# Patient Record
Sex: Female | Born: 1950
Health system: Southern US, Community
[De-identification: ages and names within clinical notes are randomized; demographics above are authoritative.]

## PROBLEM LIST (undated history)

## (undated) DIAGNOSIS — K219 Gastro-esophageal reflux disease without esophagitis: Secondary | ICD-10-CM

## (undated) DIAGNOSIS — F329 Major depressive disorder, single episode, unspecified: Secondary | ICD-10-CM

## (undated) DIAGNOSIS — F32A Depression, unspecified: Secondary | ICD-10-CM

## (undated) DIAGNOSIS — F419 Anxiety disorder, unspecified: Secondary | ICD-10-CM

## (undated) HISTORY — DX: Anxiety disorder, unspecified: F41.9

## (undated) HISTORY — DX: Depression, unspecified: F32.A

## (undated) HISTORY — DX: Major depressive disorder, single episode, unspecified: F32.9

---

## 2002-12-10 ENCOUNTER — Encounter: Payer: Self-pay | Admitting: Family Medicine

## 2002-12-10 ENCOUNTER — Encounter: Admission: RE | Admit: 2002-12-10 | Discharge: 2002-12-10 | Payer: Self-pay | Admitting: Family Medicine

## 2003-11-26 ENCOUNTER — Other Ambulatory Visit: Admission: RE | Admit: 2003-11-26 | Discharge: 2003-11-26 | Payer: Self-pay | Admitting: Family Medicine

## 2003-12-15 ENCOUNTER — Encounter: Admission: RE | Admit: 2003-12-15 | Discharge: 2003-12-15 | Payer: Self-pay | Admitting: Family Medicine

## 2005-01-03 ENCOUNTER — Other Ambulatory Visit: Admission: RE | Admit: 2005-01-03 | Discharge: 2005-01-03 | Payer: Self-pay | Admitting: Family Medicine

## 2005-01-24 ENCOUNTER — Encounter: Admission: RE | Admit: 2005-01-24 | Discharge: 2005-01-24 | Payer: Self-pay | Admitting: Family Medicine

## 2006-02-21 ENCOUNTER — Encounter: Admission: RE | Admit: 2006-02-21 | Discharge: 2006-02-21 | Payer: Self-pay | Admitting: Family Medicine

## 2006-03-29 ENCOUNTER — Other Ambulatory Visit: Admission: RE | Admit: 2006-03-29 | Discharge: 2006-03-29 | Payer: Self-pay | Admitting: Family Medicine

## 2007-02-26 ENCOUNTER — Encounter: Admission: RE | Admit: 2007-02-26 | Discharge: 2007-02-26 | Payer: Self-pay | Admitting: Family Medicine

## 2008-03-24 ENCOUNTER — Encounter: Admission: RE | Admit: 2008-03-24 | Discharge: 2008-03-24 | Payer: Self-pay | Admitting: Family Medicine

## 2008-04-02 ENCOUNTER — Encounter: Admission: RE | Admit: 2008-04-02 | Discharge: 2008-04-02 | Payer: Self-pay | Admitting: Family Medicine

## 2008-09-29 ENCOUNTER — Encounter: Admission: RE | Admit: 2008-09-29 | Discharge: 2008-09-29 | Payer: Self-pay | Admitting: Family Medicine

## 2009-03-30 ENCOUNTER — Encounter: Admission: RE | Admit: 2009-03-30 | Discharge: 2009-03-30 | Payer: Self-pay | Admitting: Family Medicine

## 2010-05-18 ENCOUNTER — Encounter
Admission: RE | Admit: 2010-05-18 | Discharge: 2010-05-18 | Payer: Self-pay | Source: Home / Self Care | Attending: Family Medicine | Admitting: Family Medicine

## 2011-04-15 ENCOUNTER — Other Ambulatory Visit: Payer: Self-pay | Admitting: Family Medicine

## 2011-04-15 DIAGNOSIS — Z1231 Encounter for screening mammogram for malignant neoplasm of breast: Secondary | ICD-10-CM

## 2011-05-30 ENCOUNTER — Ambulatory Visit
Admission: RE | Admit: 2011-05-30 | Discharge: 2011-05-30 | Disposition: A | Discharge status: Home / Self Care | Payer: 59 | Source: Ambulatory Visit | Attending: Family Medicine | Admitting: Family Medicine

## 2011-05-30 DIAGNOSIS — Z1231 Encounter for screening mammogram for malignant neoplasm of breast: Secondary | ICD-10-CM

## 2011-09-21 ENCOUNTER — Ambulatory Visit
Admission: RE | Admit: 2011-09-21 | Discharge: 2011-09-21 | Disposition: A | Discharge status: Home / Self Care | Payer: 59 | Source: Ambulatory Visit | Attending: Gastroenterology | Admitting: Gastroenterology

## 2011-09-21 ENCOUNTER — Other Ambulatory Visit: Payer: Self-pay | Admitting: Gastroenterology

## 2011-09-21 DIAGNOSIS — Q438 Other specified congenital malformations of intestine: Secondary | ICD-10-CM

## 2012-05-16 ENCOUNTER — Other Ambulatory Visit: Payer: Self-pay | Admitting: Family Medicine

## 2012-05-16 DIAGNOSIS — Z1231 Encounter for screening mammogram for malignant neoplasm of breast: Secondary | ICD-10-CM

## 2012-06-04 ENCOUNTER — Ambulatory Visit
Admission: RE | Admit: 2012-06-04 | Discharge: 2012-06-04 | Disposition: A | Discharge status: Home / Self Care | Payer: Commercial Managed Care - PPO | Source: Ambulatory Visit | Attending: Family Medicine | Admitting: Family Medicine

## 2012-06-04 DIAGNOSIS — Z1231 Encounter for screening mammogram for malignant neoplasm of breast: Secondary | ICD-10-CM

## 2013-05-10 ENCOUNTER — Other Ambulatory Visit: Payer: Self-pay

## 2013-05-10 DIAGNOSIS — Z1231 Encounter for screening mammogram for malignant neoplasm of breast: Secondary | ICD-10-CM

## 2013-06-11 ENCOUNTER — Ambulatory Visit
Admission: RE | Admit: 2013-06-11 | Discharge: 2013-06-11 | Disposition: A | Discharge status: Home / Self Care | Payer: Commercial Managed Care - PPO | Source: Ambulatory Visit

## 2013-06-11 DIAGNOSIS — Z1231 Encounter for screening mammogram for malignant neoplasm of breast: Secondary | ICD-10-CM

## 2013-08-15 ENCOUNTER — Emergency Department (HOSPITAL_COMMUNITY): Payer: Commercial Managed Care - PPO

## 2013-08-15 ENCOUNTER — Encounter (HOSPITAL_COMMUNITY): Payer: Self-pay | Admitting: Emergency Medicine

## 2013-08-15 ENCOUNTER — Encounter (HOSPITAL_COMMUNITY): Payer: Self-pay

## 2013-08-15 ENCOUNTER — Observation Stay (HOSPITAL_COMMUNITY)
Admission: RE | Admit: 2013-08-15 | Discharge: 2013-08-15 | Disposition: A | Discharge status: Other Acute Inpatient Hospital | Payer: Commercial Managed Care - PPO | Attending: Family | Admitting: Family

## 2013-08-15 ENCOUNTER — Observation Stay (HOSPITAL_BASED_OUTPATIENT_CLINIC_OR_DEPARTMENT_OTHER)
Admission: AD | Admit: 2013-08-15 | Discharge: 2013-08-16 | Disposition: A | Discharge status: Home / Self Care | Payer: Commercial Managed Care - PPO | Source: Intra-hospital | Attending: Family | Admitting: Family

## 2013-08-15 ENCOUNTER — Emergency Department (HOSPITAL_COMMUNITY)
Admission: EM | Admit: 2013-08-15 | Discharge: 2013-08-15 | Disposition: A | Discharge status: Other Acute Inpatient Hospital | Payer: Commercial Managed Care - PPO | Attending: Emergency Medicine | Admitting: Emergency Medicine

## 2013-08-15 DIAGNOSIS — K219 Gastro-esophageal reflux disease without esophagitis: Secondary | ICD-10-CM | POA: Insufficient documentation

## 2013-08-15 DIAGNOSIS — F329 Major depressive disorder, single episode, unspecified: Secondary | ICD-10-CM | POA: Insufficient documentation

## 2013-08-15 DIAGNOSIS — Z79899 Other long term (current) drug therapy: Secondary | ICD-10-CM | POA: Insufficient documentation

## 2013-08-15 DIAGNOSIS — F39 Unspecified mood [affective] disorder: Secondary | ICD-10-CM | POA: Insufficient documentation

## 2013-08-15 DIAGNOSIS — H538 Other visual disturbances: Secondary | ICD-10-CM | POA: Insufficient documentation

## 2013-08-15 DIAGNOSIS — F3289 Other specified depressive episodes: Secondary | ICD-10-CM | POA: Insufficient documentation

## 2013-08-15 DIAGNOSIS — G4700 Insomnia, unspecified: Secondary | ICD-10-CM | POA: Insufficient documentation

## 2013-08-15 DIAGNOSIS — R630 Anorexia: Secondary | ICD-10-CM | POA: Insufficient documentation

## 2013-08-15 DIAGNOSIS — G479 Sleep disorder, unspecified: Secondary | ICD-10-CM | POA: Insufficient documentation

## 2013-08-15 DIAGNOSIS — R0789 Other chest pain: Secondary | ICD-10-CM | POA: Insufficient documentation

## 2013-08-15 DIAGNOSIS — F32A Depression, unspecified: Secondary | ICD-10-CM

## 2013-08-15 DIAGNOSIS — F411 Generalized anxiety disorder: Secondary | ICD-10-CM | POA: Insufficient documentation

## 2013-08-15 DIAGNOSIS — R0602 Shortness of breath: Secondary | ICD-10-CM | POA: Insufficient documentation

## 2013-08-15 HISTORY — DX: Gastro-esophageal reflux disease without esophagitis: K21.9

## 2013-08-15 LAB — RAPID URINE DRUG SCREEN, HOSP PERFORMED
Amphetamines: NOT DETECTED
BARBITURATES: NOT DETECTED
BENZODIAZEPINES: NOT DETECTED
Cocaine: NOT DETECTED
Opiates: NOT DETECTED
Tetrahydrocannabinol: NOT DETECTED

## 2013-08-15 LAB — COMPREHENSIVE METABOLIC PANEL
ALBUMIN: 4 g/dL (ref 3.5–5.2)
ALK PHOS: 53 U/L (ref 39–117)
ALT: 17 U/L (ref 0–35)
AST: 20 U/L (ref 0–37)
BUN: 10 mg/dL (ref 6–23)
CO2: 27 mEq/L (ref 19–32)
Calcium: 10 mg/dL (ref 8.4–10.5)
Chloride: 99 mEq/L (ref 96–112)
Creatinine, Ser: 0.57 mg/dL (ref 0.50–1.10)
GFR calc Af Amer: >90 mL/min (ref >90)
GFR calc non Af Amer: >90 mL/min (ref >90)
Glucose, Bld: 91 mg/dL (ref 70–99)
POTASSIUM: 3.9 meq/L (ref 3.7–5.3)
SODIUM: 138 meq/L (ref 137–147)
TOTAL PROTEIN: 6.6 g/dL (ref 6.0–8.3)
Total Bilirubin: 0.4 mg/dL (ref 0.3–1.2)

## 2013-08-15 LAB — CBC
HEMATOCRIT: 38.9 % (ref 36.0–46.0)
Hemoglobin: 14 g/dL (ref 12.0–15.0)
MCH: 32 pg (ref 26.0–34.0)
MCHC: 36 g/dL (ref 30.0–36.0)
MCV: 89 fL (ref 78.0–100.0)
PLATELETS: 286 10*3/uL (ref 150–400)
RBC: 4.37 MIL/uL (ref 3.87–5.11)
RDW: 13.1 % (ref 11.5–15.5)
WBC: 6 10*3/uL (ref 4.0–10.5)

## 2013-08-15 LAB — SALICYLATE LEVEL: Salicylate Lvl: <2 mg/dL — ABNORMAL LOW (ref 2.8–20.0)

## 2013-08-15 LAB — I-STAT TROPONIN, ED: Troponin i, poc: 0 ng/mL (ref 0.00–0.08)

## 2013-08-15 LAB — ACETAMINOPHEN LEVEL

## 2013-08-15 LAB — ETHANOL

## 2013-08-15 MED ORDER — ESCITALOPRAM OXALATE 5 MG PO TABS
5.0000 mg | ORAL_TABLET | Freq: Once | ORAL | Status: AC
  Administered 2013-08-15: 5 mg via ORAL
  Filled 2013-08-15: qty 1

## 2013-08-15 MED ORDER — CLONAZEPAM 0.5 MG PO TABS: 0.2500 mg | ORAL_TABLET | Freq: Every day | ORAL | Status: DC

## 2013-08-15 MED ORDER — POLYETHYL GLYCOL-PROPYL GLYCOL 0.4-0.3 % OP SOLN
2.0000 [drp] | Freq: Two times a day (BID) | OPHTHALMIC | Status: DC
  Filled 2013-08-15 (×4): qty 1

## 2013-08-15 MED ORDER — ESCITALOPRAM OXALATE 5 MG PO TABS
5.0000 mg | ORAL_TABLET | Freq: Every day | ORAL | Status: DC
  Filled 2013-08-15 (×3): qty 1

## 2013-08-15 MED ORDER — HYDROXYZINE HCL 25 MG PO TABS: 25.0000 mg | ORAL_TABLET | Freq: Four times a day (QID) | ORAL | Status: DC | PRN

## 2013-08-15 MED ORDER — ESCITALOPRAM OXALATE 10 MG PO TABS
10.0000 mg | ORAL_TABLET | Freq: Every day | ORAL | Status: DC
  Administered 2013-08-16: 10 mg via ORAL
  Filled 2013-08-15 (×3): qty 1

## 2013-08-15 MED ORDER — ACETAMINOPHEN 325 MG PO TABS: 650.0000 mg | ORAL_TABLET | Freq: Four times a day (QID) | ORAL | Status: DC | PRN

## 2013-08-15 MED ORDER — ALUM & MAG HYDROXIDE-SIMETH 200-200-20 MG/5ML PO SUSP: 30.0000 mL | ORAL | Status: DC | PRN

## 2013-08-15 MED ORDER — CLONAZEPAM 0.5 MG PO TABS
0.5000 mg | ORAL_TABLET | Freq: Every day | ORAL | Status: DC
  Administered 2013-08-15: 0.5 mg via ORAL
  Filled 2013-08-15: qty 1

## 2013-08-15 MED ORDER — PANTOPRAZOLE SODIUM 40 MG PO TBEC
80.0000 mg | DELAYED_RELEASE_TABLET | Freq: Every day | ORAL | Status: DC
  Administered 2013-08-16: 80 mg via ORAL
  Filled 2013-08-15 (×3): qty 2

## 2013-08-15 MED ORDER — MAGNESIUM HYDROXIDE 400 MG/5ML PO SUSP: 30.0000 mL | Freq: Every day | ORAL | Status: DC | PRN

## 2013-08-15 NOTE — ED Provider Notes (Signed)
CSN: [id number]     Arrival date & time 08/15/13  1352 History  This chart was scribed for non-physician practitioner working with Carol Ehlers, MD by Stacy Gardner, ED scribe. This patient was seen in room WTR3/WLPT3 and the patient's care was started at 3:00 PM.   First MD Initiated Contact with Patient 08/15/13 1441     Chief Complaint  Patient presents with  . Medical Clearance     (Consider location/radiation/quality/duration/timing/severity/associated sxs/prior Treatment) The history is provided by the patient and a relative.   HPI Comments: Carol Mercado is a 63 y.o. female who presents to the Emergency Department for medical clearance. Pt's daughter drove from out of state because she is concerned about her mother's worsening anxiety and depression. Pt was screened at [facility] and came to the ED for depression and anxiety. She speculates her symptoms started three weeks ago. Pt has intermittent chest tightness, difficulty breathing, loss of appetite and rapid heart rate that occurs mostly at night and when she is very anxious. The chest tightness is described as "uncomfortable" and rated as a 5/10 in severity. The episodes generally last 30 seconds to 5-10 minutes. When her symptoms presents it is typically at night when she is trying to rest and in stressful situations. The chest pain is improved with deep breathing and walking or exertion.  Her symptoms are not present outside of stressful situations. She states this is her first time having these feelings and she does not want her symptoms to overcome who she is. She has thoughts of hopelessness and feeling dismayed however she denies SI. Denies plan for suicide.  Pt's daughter states her symptoms started suddenly. Pt initially tried Ambien however it did not "agree" with her and she stopped taking it two days later. She was seen eight days ago and was given Lexapro and Xanax. Pt's daughter and husband mentions her symptoms are getting  worse. 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   Pt has visual changes due to spending a great amount of time on her computer. Pt feels that she is "straining" to see.  She wears contacts but reports they are ineffective. Denies seeing visual spots, loss of parts of visual field, dark curtain over her vision, diplipia. Pt was last seen at her opthalmologist four months ago.  Past Medical History  Diagnosis Date  . GERD (gastroesophageal reflux disease)    History reviewed. No pertinent past surgical history. No family history on file. History  Substance Use Topics  . Smoking status: Never Smoker   . Smokeless tobacco: Never Used  . Alcohol Use: 0.6 oz/week    1 Glasses of wine per week   OB History   Grav Para Term Preterm Abortions TAB SAB Ect Mult Living                 Review of Systems  Constitutional: Positive for activity change and appetite change. Negative for diaphoresis.  Eyes: Positive for visual disturbance.  Respiratory: Positive for chest tightness and shortness of breath. Negative for cough (chronic cough but not abnormal from baseline).   Cardiovascular:       Racing heart rate  Gastrointestinal: Negative for nausea and vomiting.  Psychiatric/Behavioral: Positive for sleep disturbance and dysphoric mood. Negative for suicidal ideas. The patient is nervous/anxious.   All other systems reviewed and are negative.     Allergies  Aspartame and phenylalanine  Home Medications  Prior to Admission medications   Medication Sig Start Date End Date Taking? Authorizing Provider  ALPRAZolam Duanne Moron) 0.25 MG tablet Take 0.25 mg by mouth at bedtime as needed for anxiety.    Yes Historical Provider, MD  azelastine (ASTELIN) 137 MCG/SPRAY nasal spray Place 2 sprays into both nostrils daily. Use in each nostril as directed   Yes Historical Provider, MD  escitalopram (LEXAPRO) 10 MG tablet Take 5 mg by mouth daily.   Yes  Historical Provider, MD  MELATONIN PO Take 1 tablet by mouth daily as needed (for sleep).   Yes Historical Provider, MD  omeprazole (PRILOSEC) 40 MG capsule Take 40 mg by mouth daily.   Yes Historical Provider, MD  Polyethyl Glycol-Propyl Glycol (SYSTANE) 0.4-0.3 % SOLN Place 2 drops into both eyes 2 (two) times daily.   Yes Historical Provider, MD   BP 142/79  Temp(Src) 98 F (36.7 C) (Oral)  SpO2 97% Physical Exam  Nursing note and vitals reviewed. Constitutional: She appears well-developed and well-nourished. No distress.  HENT:  Head: Normocephalic and atraumatic.  Eyes: Conjunctivae and EOM are normal. Right eye exhibits no discharge. Left eye exhibits no discharge. No scleral icterus.  Neck: Normal range of motion. Neck supple.  Cardiovascular: Normal rate and regular rhythm.   Pulmonary/Chest: Effort normal and breath sounds normal. No respiratory distress. She has no wheezes. She has no rales.  Abdominal: Soft. She exhibits no distension. There is no tenderness. There is no rebound and no guarding.  Neurological: She is alert.  CN II-XII intact, EOMs intact, no pronator drift, grip strengths equal bilaterally; strength 5/5 in all extremities, sensation intact in all extremities; finger to nose, heel to shin, rapid alternating movements normal; gait is normal.     Skin: She is not diaphoretic.  Psychiatric: Her speech is normal and behavior is normal. She exhibits a depressed mood. She expresses no suicidal plans.    ED Course  Procedures (including critical care time) DIAGNOSTIC STUDIES: Oxygen Saturation is 97% on room air, normal by my interpretation.    COORDINATION OF CARE:  3:09 PM Discussed course of care with pt . Pt understands and agrees.    Labs Review Labs Reviewed  SALICYLATE LEVEL - Abnormal; Notable for the following:    Salicylate Lvl <5.8 (*)    All other components within normal limits  ACETAMINOPHEN LEVEL  CBC  COMPREHENSIVE METABOLIC PANEL   ETHANOL  URINE RAPID DRUG SCREEN (HOSP PERFORMED)  Randolm Idol, ED    Imaging Review Dg Chest 2 View  08/15/2013   CLINICAL DATA:  MEDICAL CLEARANCE  EXAM: CHEST  2 VIEW  COMPARISON:  None.  FINDINGS: The heart size and mediastinal contours are within normal limits. Both lungs are clear. S-shaped scoliosis in the thoracolumbar spine.  IMPRESSION: No active cardiopulmonary disease.   Electronically Signed   By: Margaree Mackintosh M.D.   On: 08/15/2013 15:33     EKG Interpretation   Date/Time:  Thursday August 15 2013 14:42:16 EDT Ventricular Rate:  69 PR Interval:  141 QRS Duration: 82 QT Interval:  388 QTC Calculation: 416 R Axis:   -8 Text Interpretation:  Sinus rhythm Probable left atrial enlargement  Borderline low voltage, extremity leads Baseline wander in lead(s) I III  aVL V5 Confirmed by [place] ([id number]) on 08/15/2013 4:41:16 PM      MDM   Final diagnoses:  Depression    Pt with worsening depression over 3 weeks, making it difficult for her to function.  She has vague notions of not wanting to go on with life but no plan for suicide.  Has started medications for depression from her PCP but continues to get worse.  Sent to behavioral health for further evaluation and likely admission for depression.      Clayton Bibles, PA-C 08/15/13 1837

## 2013-08-15 NOTE — ED Notes (Addendum)
Admitted at [facility] in observation room for depression and anxiety. Reports no neurological workup. Pt reports "I am struggling with my vision." Wears contacts but has not been to see optometrist. Daughter reports that "everything has happened at once with the vision and depression and everything." Patient has only been taking Lexapro and Xanax are new-has been taking for 8 days. Feels like her "head and heart are off kilter and the anxiety makes my chest constrict."

## 2013-08-15 NOTE — H&P (Signed)
[facility] OBS Admission Note & SRA  08/15/2013 7:58 PM Carol Mercado  MRN:  [id number] Subjective:  Pt presents to OBS complaining of severe anxiety and depression along with anxiety attacks. Pt denies SI, HI, and AVH, and contracts for safety. However, due to pt's instability, there is question as to whether or not she can function well in daily life or complete ADL's without assistance; this concern is also affirmed by family. Pt will be observed in OBS UNIT to determine more accurate disposition.   HPI: Carol Mercado is a 63 y.o. female who presents to the Emergency Department for medical clearance. Pt's daughter drove from out of state because she is concerned about her mother's worsening anxiety and depression. Pt was screened at [facility] and came to the ED for depression and anxiety. She speculates her symptoms started three weeks ago. Pt has intermittent chest tightness, difficulty breathing, loss of appetite and rapid heart rate that occurs mostly at night and when she is very anxious. The chest tightness is described as "uncomfortable" and rated as a 5/10 in severity. The episodes generally last 30 seconds to 5-10 minutes. When her symptoms presents it is typically at night when she is trying to rest and in stressful situations. The chest pain is improved with deep breathing and walking or exertion. Her symptoms are not present outside of stressful situations. She states this is her first time having these feelings and she does not want her symptoms to overcome who she is. She has thoughts of hopelessness and feeling dismayed however she denies SI. Denies plan for suicide.  Pt's daughter states her symptoms started suddenly. Pt initially tried Ambien however it did not "agree" with her and she stopped taking it two days later. She was seen eight days ago and was given Lexapro and Xanax. Pt's daughter and husband mentions her symptoms are getting worse. 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 Pt has visual changes due to spending a great amount of time on her computer. Pt feels that she is "straining" to see. She wears contacts but reports they are ineffective. Denies seeing visual spots, loss of parts of visual field, dark curtain over her vision, diplipia. Pt was last seen at her opthalmologist four months ago.     Diagnosis:   DSM5: Depressive Disorders:  Major Depressive Disorder - Severe (296.23) Total Time spent with patient: 30 minutes  Axis I: Major Depression, Recurrent severe Axis II: Deferred Axis III:  Past Medical History  Diagnosis Date  . GERD (gastroesophageal reflux disease)   . Anxiety   . Depression    Axis IV: other psychosocial or environmental problems and problems related to social environment Axis V: 51-60 moderate symptoms  ADL's:  Impaired  Sleep: Fair  Appetite:  Poor  Suicidal Ideation:  Affirms SI without specific plan Homicidal Ideation:  Denies AEB (as evidenced by):  Psychiatric Specialty Exam: Physical Exam Full Physical Exam performed in ED; reviewed, stable, and I concur with this assessment.   Review of Systems  Constitutional: Negative.   HENT: Negative.   Eyes: Negative.   Respiratory: Negative.   Cardiovascular: Negative.   Gastrointestinal: Negative.   Genitourinary: Negative.   Musculoskeletal: Negative.   Skin: Negative.   Neurological: Negative.   Endo/Heme/Allergies: Negative.   Psychiatric/Behavioral: Positive for depression. The patient is nervous/anxious.     Blood pressure 106/70, pulse 77, temperature 97.6 F (36.4 C), temperature source Oral, resp. rate  17.There is no height or weight on file to calculate BMI.   General Appearance: Fairly Groomed   Engineer, water:: Good   Speech: Clear and Coherent   Volume: Normal   Mood: Dysphoric   Affect: Congruent   Thought Process: Coherent   Orientation: Full (Time, Place, and Person)   Thought Content: WDL   Suicidal  Thoughts: Yes, without specific plan  Homicidal Thoughts: No   Memory: Immediate; Good   Judgement: Good   Insight: Fair   Psychomotor Activity: Decreased   Concentration: Fair   Recall: [place] of Knowledge:Good   Language: Good   Akathisia: NA   Handed: Right   AIMS (if indicated): NA   Assets: Communication Skills  Desire for Improvement  Financial Resources/Insurance  Tea  Talents/Skills  Vocational/Educational   Sleep: Very disturbed/poor     Musculoskeletal: Strength & Muscle Tone: within normal limits Gait & Station: normal Patient leans: N/A  Current Medications: Current Facility-Administered Medications  Medication Dose Route Frequency Provider Last Rate Last Dose  . acetaminophen (TYLENOL) tablet 650 mg  650 mg Oral Q6H PRN Benjamine Mola, FNP      . alum & mag hydroxide-simeth (MAALOX/MYLANTA) 200-200-20 MG/5ML suspension 30 mL  30 mL Oral Q4H PRN Benjamine Mola, FNP      . clonazePAM (KLONOPIN) tablet 0.5 mg  0.5 mg Oral QHS Benjamine Mola, FNP      . [START ON 08/16/2013] escitalopram (LEXAPRO) tablet 10 mg  10 mg Oral Daily John C Withrow, FNP      . escitalopram (LEXAPRO) tablet 5 mg  5 mg Oral Once Benjamine Mola, FNP      . hydrOXYzine (ATARAX/VISTARIL) tablet 25 mg  25 mg Oral Q6H PRN Benjamine Mola, FNP      . magnesium hydroxide (MILK OF MAGNESIA) suspension 30 mL  30 mL Oral Daily PRN Benjamine Mola, FNP      . pantoprazole (PROTONIX) EC tablet 80 mg  80 mg Oral Daily Benjamine Mola, FNP      . Polyethyl Glycol-Propyl Glycol 0.4-0.3 % SOLN 2 drop  2 drop Both Eyes BID Benjamine Mola, FNP        Lab Results:  Results for orders placed during the hospital encounter of 08/15/13 (from the past 48 hour(s))  URINE RAPID DRUG SCREEN (HOSP PERFORMED)     Status: None   Collection Time    08/15/13  2:43 PM      Result Value Ref Range   Opiates NONE DETECTED  NONE DETECTED   Cocaine NONE DETECTED  NONE DETECTED   Benzodiazepines NONE  DETECTED  NONE DETECTED   Amphetamines NONE DETECTED  NONE DETECTED   Tetrahydrocannabinol NONE DETECTED  NONE DETECTED   Barbiturates NONE DETECTED  NONE DETECTED   Comment:            DRUG SCREEN FOR MEDICAL PURPOSES     ONLY.  IF CONFIRMATION IS NEEDED     FOR ANY PURPOSE, NOTIFY LAB     WITHIN 5 DAYS.                LOWEST DETECTABLE LIMITS     FOR URINE DRUG SCREEN     Drug Class       Cutoff (ng/mL)     Amphetamine      1000     Barbiturate      200     Benzodiazepine   200  Tricyclics       625     Opiates          300     Cocaine          300     THC              50  ACETAMINOPHEN LEVEL     Status: None   Collection Time    08/15/13  3:34 PM      Result Value Ref Range   Acetaminophen (Tylenol), Serum <15.0  10 - 30 ug/mL   Comment:            THERAPEUTIC CONCENTRATIONS VARY     SIGNIFICANTLY. A RANGE OF 10-30     ug/mL MAY BE AN EFFECTIVE     CONCENTRATION FOR MANY PATIENTS.     HOWEVER, SOME ARE BEST TREATED     AT CONCENTRATIONS OUTSIDE THIS     RANGE.     ACETAMINOPHEN CONCENTRATIONS     >150 ug/mL AT 4 HOURS AFTER     INGESTION AND >50 ug/mL AT 12     HOURS AFTER INGESTION ARE     OFTEN ASSOCIATED WITH TOXIC     REACTIONS.  CBC     Status: None   Collection Time    08/15/13  3:34 PM      Result Value Ref Range   WBC 6.0  4.0 - 10.5 K/uL   RBC 4.37  3.87 - 5.11 MIL/uL   Hemoglobin 14.0  12.0 - 15.0 g/dL   HCT 38.9  36.0 - 46.0 %   MCV 89.0  78.0 - 100.0 fL   MCH 32.0  26.0 - 34.0 pg   MCHC 36.0  30.0 - 36.0 g/dL   RDW 13.1  11.5 - 15.5 %   Platelets 286  150 - 400 K/uL  COMPREHENSIVE METABOLIC PANEL     Status: None   Collection Time    08/15/13  3:34 PM      Result Value Ref Range   Sodium 138  137 - 147 mEq/L   Potassium 3.9  3.7 - 5.3 mEq/L   Chloride 99  96 - 112 mEq/L   CO2 27  19 - 32 mEq/L   Glucose, Bld 91  70 - 99 mg/dL   BUN 10  6 - 23 mg/dL   Creatinine, Ser 0.57  0.50 - 1.10 mg/dL   Calcium 10.0  8.4 - 10.5 mg/dL   Total  Protein 6.6  6.0 - 8.3 g/dL   Albumin 4.0  3.5 - 5.2 g/dL   AST 20  0 - 37 U/L   ALT 17  0 - 35 U/L   Alkaline Phosphatase 53  39 - 117 U/L   Total Bilirubin 0.4  0.3 - 1.2 mg/dL   GFR calc non Af Amer >90  >90 mL/min   GFR calc Af Amer >90  >90 mL/min   Comment: (NOTE)     The eGFR has been calculated using the CKD EPI equation.     This calculation has not been validated in all clinical situations.     eGFR's persistently <90 mL/min signify possible Chronic Kidney     Disease.  ETHANOL     Status: None   Collection Time    08/15/13  3:34 PM      Result Value Ref Range   Alcohol, Ethyl (B) <11  0 - 11 mg/dL   Comment:  LOWEST DETECTABLE LIMIT FOR     SERUM ALCOHOL IS 11 mg/dL     FOR MEDICAL PURPOSES ONLY  SALICYLATE LEVEL     Status: Abnormal   Collection Time    08/15/13  3:34 PM      Result Value Ref Range   Salicylate Lvl <1.2 (*) 2.8 - 20.0 mg/dL  I-STAT TROPOININ, ED     Status: None   Collection Time    08/15/13  3:41 PM      Result Value Ref Range   Troponin i, poc 0.00  0.00 - 0.08 ng/mL   Comment 3            Comment: Due to the release kinetics of cTnI,     a negative result within the first hours     of the onset of symptoms does not rule out     myocardial infarction with certainty.     If myocardial infarction is still suspected,     repeat the test at appropriate intervals.    Physical Findings: AIMS:  , ,  ,  ,    CIWA:    COWS:     Treatment Plan Summary: Daily contact with patient to assess and evaluate symptoms and progress in treatment Medication management  Plan: Review of chart, vital signs, medications, and notes.  1-Medication management for depression and anxiety: Medications reviewed with the patient and she stated no untoward effects, unchanged. 2-Coping skills for depression, anxiety  3-Continue crisis stabilization and management  4-Address health issues--monitoring vital signs, stable  5-Treatment plan in progress to  prevent relapse of depression and anxiety   Medical Decision Making Problem Points:  Established problem, stable/improving (1) and Review of psycho-social stressors (1) Data Points:  Review or order clinical lab tests (1) Review or order medicine tests (1) Review of medication regiment & side effects (2) Review of new medications or change in dosage (2)   Suicide Risk Admission Assessment     Nursing information obtained from:  Patient Demographic factors:  Caucasian Current Mental Status:  NA Loss Factors:  NA Historical Factors:  Family history of mental illness or substance abuse Risk Reduction Factors:  Sense of responsibility to family;Religious beliefs about death;Living with another person, especially a relative;Positive social support Total Time spent with patient: 30 minutes  CLINICAL FACTORS:   Depression:   Anhedonia Hopelessness Impulsivity Unstable or Poor Therapeutic Relationship Previous Psychiatric Diagnoses and Treatments  Psychiatric Specialty Exam:     Blood pressure 139/89, pulse 84, temperature 98.1 F (36.7 C), temperature source Oral, resp. rate 16, height '5\' 2"'  (1.575 m), weight 52.164 kg (115 lb).Body mass index is 21.03 kg/(m^2).  SEE PSE ABOVE  COGNITIVE FEATURES THAT CONTRIBUTE TO RISK:  Polarized thinking    SUICIDE RISK:   Moderate:  Frequent suicidal ideation with limited intensity, and duration, some specificity in terms of plans, no associated intent, good self-control, limited dysphoria/symptomatology, some risk factors present, and identifiable protective factors, including available and accessible social support.   Benjamine Mola, FNP-BC 08/15/2013, 7:58 PM Agree with assessment and plan Geralyn Flash A. Sabra Heck, M.D.

## 2013-08-15 NOTE — ED Notes (Signed)
[facility] observation unit called and spoke to [place], Therapist, sports. No handoff report requested. Updated provider on patient's current condition and suicidal ideations. Family aware and updated. Admitting physician [place], Mojeed. [facility] transport called.

## 2013-08-15 NOTE — ED Provider Notes (Signed)
Medical screening examination/treatment/procedure(s) were performed by non-physician practitioner and as supervising physician I was immediately available for consultation/collaboration.   Neta Ehlers, MD 08/15/13 [phone]

## 2013-08-15 NOTE — Progress Notes (Signed)
Per Lenna Gilford NP patient sent to [facility] by [facility] for med clearance.

## 2013-08-15 NOTE — Progress Notes (Signed)
Patient in bed since the beginning of the shift. Her mood and affects flat and depressed. Answered all questiond appropriately. She denied SI/HI and denied Hallucinations. Writer encouraged and supported patient. She refused her eye drops and some of her HS medications. Received Clonopin at HS. Q 15 minute check continues as ordered to maintain safety.

## 2013-08-15 NOTE — BH Assessment (Signed)
63 year old female admitted as a walk-in, accompanied by her husband, for increased depression and anxiety. Patient admits to feelings overwhelmed since March 2015. Patient admits to to feeling overwhelmed, anxious, hopeless and helpless. She denies SI/HI or psychosis. 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 Patient has had no previous mental health hospitalizations or mental health counseling. She is currently seeing Dr. Carol Ada at [facility] for her depression. She was started on Lexapro 5 mg QD and Xanax .25 mg TID PRN. Patient oriented to unit. Daughter is currently at her bedside for support.

## 2013-08-15 NOTE — BH Assessment (Signed)
NP in to speak with patient and patient's daughter.

## 2013-08-15 NOTE — BH Assessment (Addendum)
Patient reports that she is having vision problems and that documents appear blurry. Patient wears contacts that she states are not very strong but that the change in vision is a recent thing. Patient does not have her contacts with her and is using her husband's readers to complete her paperwork.  Patient's daughter is requesting to speak with NP. NP called with request.

## 2013-08-15 NOTE — BH Assessment (Signed)
Tele Assessment Note   Carol Mercado is an 63 y.o. female that presents to [facility] as a walk in with symptoms or worsening depression and anxiety. She was brought to [facility] by her spouse. She was also referred to [facility] by a close family friend/psychiatrist Dr. Launa Flight. Patient shares with this Probation officer that she has experienced many stressors starting around Christmas time 2014- present. She broke her thumb which required surgery. Next, she began having difficulty with her voice causing her not to be able to do what she loves so dearly "singing". 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 Last, her job at [employer] has suffered as she is not able to complete her job task. She was told by her supervisor to take today and tomorrow off as it's evident that patient is not able to complete job task (example: formulating emails or scheduling staff). Patient fears that she may have loss her job and may not be able to return. She does not have a prior hx of depression or anxiety. However, over the past months her anxiety has remained severe. Her depression has worsened with associated loss of interest in usual pleasures (walking and cycling), hopelessness, crying spells, and isolating self from others. Her appetite is poor with approx. 3-4 pounds of weight loss. She has not slept well in the past 3-4 weeks and remains constantly tired at work..   Patient reports passive suicidal thoughts with no plan or intent. She finds hope/motivation in her family: children, spouse, and children to improve her current symptoms. Her daughter is currently in route from [name] to provide support at this time. No HI or AVH's. No alcohol or drug use reported. No current outpatient mental health provider reported. No hx of inpatient hospitalization reported.  Writer ran Radiographer, therapeutic by Heloise Purpura, NP and the observation unit was recommended. Patient accepted to the OBS Unit and placed in bed #2.  Axis  I: Anxiety Disorder NOS and Depressive Disorder NOS Axis II: Deferred Axis III: No past medical history on file. Axis IV: economic problems, occupational problems, other psychosocial or environmental problems, problems related to social environment and problems with access to health care services Axis V: 31-40 impairment in reality testing  Past Medical History: No past medical history on file.  No past surgical history on file.  Family History: No family history on file.  Social History:  has no tobacco, alcohol, and drug history on file.  Additional Social History:     CIWA:   COWS:    Allergies: Allergies not on file  Home Medications:  No prescriptions prior to admission    OB/GYN Status:  No LMP recorded. Patient is postmenopausal.  General Assessment Data Location of Assessment: [facility] Is this a Tele or Face-to-Face Assessment?: Face-to-Face Is this an Initial Assessment or a Re-assessment for this encounter?: Initial Assessment Living Arrangements: Spouse/significant other Can pt return to current living arrangement?: Yes Admission Status: Voluntary Is patient capable of signing voluntary admission?: Yes Transfer from: [facility] Referral Source: Self/Family/Friend     [place] Living Arrangements: Spouse/significant other Name of Psychiatrist:  (No psychiatrist) Name of Therapist:  (No therapist )  Education Status Is patient currently in school?: No  Risk to self Suicidal Ideation: Yes-Currently Present Suicidal Intent: No Is patient at risk for suicide?: No Suicidal Plan?: No Access to Means: No What has been your use of drugs/alcohol within the last 12 months?:  (no alcohol  or drug use) Previous Attempts/Gestures: No How many times?:  (0) Other Self Harm Risks:  (none reported ) Triggers for Past Attempts: Other (Comment) (no previous attempts or gestures ) Intentional Self Injurious Behavior: None Family Suicide  History: Unknown Recent stressful life event(s): Other (Comment);Loss (Comment);Job Loss;Financial Problems (not functioning at work, possibly loss job, sings-voice px's) Persecutory voices/beliefs?: No Depression: Yes Depression Symptoms: Feeling angry/irritable;Feeling worthless/self pity;Loss of interest in usual pleasures;Fatigue;Isolating;Tearfulness;Insomnia;Despondent;Guilt Substance abuse history and/or treatment for substance abuse?: No Suicide prevention information given to non-admitted patients: Not applicable  Risk to Others Homicidal Ideation: No Thoughts of Harm to Others: No Current Homicidal Intent: No Current Homicidal Plan: No Access to Homicidal Means: No Identified Victim:  (n/a) History of harm to others?: No Assessment of Violence: None Noted Violent Behavior Description:  (patient is calm and cooperative ) Does patient have access to weapons?: No Criminal Charges Pending?: No Does patient have a court date: No  Psychosis Hallucinations: None noted Delusions: None noted  Mental Status Report Appear/Hygiene: Other (Comment) (approrpiate ) Eye Contact: Fair Motor Activity: Restlessness (appears nervous and slightly tremorous) Speech: Logical/coherent Level of Consciousness: Alert Mood: Depressed;Anxious;Sad;Preoccupied Affect: Preoccupied;Depressed Anxiety Level: Severe Thought Processes: Coherent;Relevant Judgement: Unimpaired Orientation: Person;Situation;Time;Place Obsessive Compulsive Thoughts/Behaviors: None  Cognitive Functioning Concentration: Decreased Memory: Recent Intact;Remote Intact IQ: Average Insight: Fair Impulse Control: Fair Appetite: Poor Weight Loss:  (pt reports wt. loss but unaware of how much) Weight Gain:  (none reported ) Sleep: Decreased Total Hours of Sleep:  (pt tried Ambien 3-4 days; ended use due to side effects ) Vegetative Symptoms: Staying in bed;Decreased grooming;Not bathing  ADLScreening [facility] Assessment  Services) Patient's cognitive ability adequate to safely complete daily activities?: Yes Patient able to express need for assistance with ADLs?: Yes Independently performs ADLs?: Yes (appropriate for developmental age)  Prior Inpatient Therapy Prior Inpatient Therapy: No Prior Therapy Dates:  (n/a) Prior Therapy Facilty/Provider(s):  (n/a) Reason for Treatment:  (n/a)  Prior Outpatient Therapy Prior Outpatient Therapy: No Prior Therapy Dates:  (n/a) Prior Therapy Facilty/Provider(s):  (n/a) Reason for Treatment:  (n/a)  ADL Screening (condition at time of admission) Patient's cognitive ability adequate to safely complete daily activities?: Yes Is the patient deaf or have difficulty hearing?: No Does the patient have difficulty seeing, even when wearing glasses/contacts?: No Does the patient have difficulty concentrating, remembering, or making decisions?: No Patient able to express need for assistance with ADLs?: Yes Does the patient have difficulty dressing or bathing?: No Independently performs ADLs?: Yes (appropriate for developmental age) Does the patient have difficulty walking or climbing stairs?: No Weakness of Legs: None Weakness of Arms/Hands: None  Home Assistive Devices/Equipment Home Assistive Devices/Equipment: None    Abuse/Neglect Assessment (Assessment to be complete while patient is alone) Physical Abuse: Denies Verbal Abuse: Denies Sexual Abuse: Denies Exploitation of patient/patient's resources: Denies Self-Neglect: Denies Values / Beliefs Cultural Requests During Hospitalization: None Spiritual Requests During Hospitalization: None   Advance Directives (For Healthcare) Advance Directive: Patient does not have advance directive Nutrition Screen- [place] Adult/WL/AP Patient's home diet: Regular  Additional Information 1:1 In Past 12 Months?: No CIRT Risk: No Elopement Risk: No Does patient have medical clearance?: Yes     Disposition:   Disposition Initial Assessment Completed for this Encounter: Yes Disposition of Patient: Inpatient treatment program Type of inpatient treatment program: Adult  Evangeline Gula 08/15/2013 11:21 AM

## 2013-08-15 NOTE — Discharge Instructions (Signed)
Read the information below.  You may return to the Emergency Department at any time for worsening condition or any new symptoms that concern you.  Please go directly to [facility] for further evaluation and treatment.    Depression, Adult Depression refers to feeling sad, low, down in the dumps, blue, gloomy, or empty. In general, there are two kinds of depression: 1. Depression that we all experience from time to time because of upsetting life experiences, including the loss of a job or the ending of a relationship (normal sadness or normal grief). This kind of depression is considered normal, is short lived, and resolves within a few days to 2 weeks. (Depression experienced after the loss of a loved one is called bereavement. Bereavement often lasts longer than 2 weeks but normally gets better with time.) 2. Clinical depression, which lasts longer than normal sadness or normal grief or interferes with your ability to function at home, at work, and in school. It also interferes with your personal relationships. It affects almost every aspect of your life. Clinical depression is an illness. Symptoms of depression also can be caused by conditions other than normal sadness and grief or clinical depression. Examples of these conditions are listed as follows:  Physical illness Some physical illnesses, including underactive thyroid gland (hypothyroidism), severe anemia, specific types of cancer, diabetes, uncontrolled seizures, heart and lung problems, strokes, and chronic pain are commonly associated with symptoms of depression.  Side effects of some prescription medicine In some people, certain types of prescription medicine can cause symptoms of depression.  Substance abuse Abuse of alcohol and illicit drugs can cause symptoms of depression. SYMPTOMS Symptoms of normal sadness and normal grief include the following:  Feeling sad or crying for short periods of time.  Not caring about anything  (apathy).  Difficulty sleeping or sleeping too much.  No longer able to enjoy the things you used to enjoy.  Desire to be by oneself all the time (social isolation).  Lack of energy or motivation.  Difficulty concentrating or remembering.  Change in appetite or weight.  Restlessness or agitation. Symptoms of clinical depression include the same symptoms of normal sadness or normal grief and also the following symptoms:  Feeling sad or crying all the time.  Feelings of guilt or worthlessness.  Feelings of hopelessness or helplessness.  Thoughts of suicide or the desire to harm yourself (suicidal ideation).  Loss of touch with reality (psychotic symptoms). Seeing or hearing things that are not real (hallucinations) or having false beliefs about your life or the people around you (delusions and paranoia). DIAGNOSIS  The diagnosis of clinical depression usually is based on the severity and duration of the symptoms. Your caregiver also will ask you questions about your medical history and substance use to find out if physical illness, use of prescription medicine, or substance abuse is causing your depression. Your caregiver also may order blood tests. TREATMENT  Typically, normal sadness and normal grief do not require treatment. However, sometimes antidepressant medicine is prescribed for bereavement to ease the depressive symptoms until they resolve. The treatment for clinical depression depends on the severity of your symptoms but typically includes antidepressant medicine, counseling with a mental health professional, or a combination of both. Your caregiver will help to determine what treatment is best for you. Depression caused by physical illness usually goes away with appropriate medical treatment of the illness. If prescription medicine is causing depression, talk with your caregiver about stopping the medicine, decreasing the dose, or  substituting another medicine. Depression  caused by abuse of alcohol or illicit drugs abuse goes away with abstinence from these substances. Some adults need professional help in order to stop drinking or using drugs. SEEK IMMEDIATE CARE IF:  You have thoughts about hurting yourself or others.  You lose touch with reality (have psychotic symptoms).  You are taking medicine for depression and have a serious side effect. FOR MORE INFORMATION National Alliance on Mental Illness: www.nami.[employer] of Mental [employer]: https://carter.com/ Document Released: 04/08/2000 Document Revised: 10/11/2011 Document Reviewed: 07/11/2011 [facility] Patient Information 2014 [place].

## 2013-08-16 ENCOUNTER — Encounter (HOSPITAL_COMMUNITY): Payer: Self-pay | Admitting: *Deleted

## 2013-08-16 DIAGNOSIS — G479 Sleep disorder, unspecified: Secondary | ICD-10-CM

## 2013-08-16 DIAGNOSIS — F321 Major depressive disorder, single episode, moderate: Secondary | ICD-10-CM

## 2013-08-16 MED ORDER — ESCITALOPRAM OXALATE 10 MG PO TABS: 10.0000 mg | ORAL_TABLET | Freq: Every day | ORAL | Status: AC

## 2013-08-16 MED ORDER — TRAZODONE HCL 50 MG PO TABS: ORAL_TABLET | ORAL | Status: AC

## 2013-08-16 MED ORDER — POLYVINYL ALCOHOL 1.4 % OP SOLN
2.0000 [drp] | Freq: Two times a day (BID) | OPHTHALMIC | Status: DC
  Filled 2013-08-16: qty 15

## 2013-08-16 NOTE — Progress Notes (Signed)
[facility] INPATIENT:  Family/Significant Other Suicide Prevention Education  Suicide Prevention Education:  Education Completed; husband has been identified by the patient as the family member/significant other with whom the patient will be residing, and identified as the person(s) who will aid the patient in the event of a mental health crisis (suicidal ideations/suicide attempt).  With written consent from the patient, the family member/significant other has been provided the following suicide prevention education, prior to the and/or following the discharge of the patient.  The suicide prevention education provided includes the following:  Suicide risk factors  Suicide prevention and interventions  National Suicide Hotline telephone number  [facility] assessment telephone number  [facility] Emergency Assistance [place] and/or Residential Mobile Crisis Unit telephone number  Request made of family/significant other to:  Remove weapons (e.g., guns, rifles, knives), all items previously/currently identified as safety concern.    Remove drugs/medications (over-the-counter, prescriptions, illicit drugs), all items previously/currently identified as a safety concern.  The family member/significant other verbalizes understanding of the suicide prevention education information provided.  The family member/significant other agrees to remove the items of safety concern listed above.  Debbrah Alar 08/16/2013, 1:09 PM

## 2013-08-16 NOTE — Discharge Summary (Signed)
Physician Discharge Summary Note  Patient:  Carol Mercado is an 63 y.o., female MRN:  [id number] DOB:  [birth year] Patient phone:  [phone] (home)  Patient address:   [address] [place] [address],  Total Time spent with patient: 45 minutes  Date of Admission:  08/15/2013 Date of Discharge: 08/16/2013  Reason for Admission: Worsening c/o  Depression with insomnia  Discharge Diagnoses: Active Problems:   MDD (major depressive disorder)   Psychiatric Specialty Exam: Physical Exam  Nursing note and vitals reviewed. Constitutional: She is oriented to person, place, and time. She appears well-developed and well-nourished.  HENT:  Head: Normocephalic and atraumatic.  Right Ear: External ear normal.  Left Ear: External ear normal.  Nose: Nose normal.  Eyes: Conjunctivae and EOM are normal. Pupils are equal, round, and reactive to light. Right eye exhibits no discharge. Left eye exhibits no discharge. No scleral icterus.  Neck: Neck supple. No tracheal deviation present. No thyromegaly present.  Cardiovascular: Normal rate and regular rhythm.   Respiratory: Effort normal and breath sounds normal. No respiratory distress.  GI:  deferred  Genitourinary:  deferred  Musculoskeletal: Normal range of motion. She exhibits no edema and no tenderness.  Neurological: She is alert and oriented to person, place, and time. No cranial nerve deficit. She exhibits normal muscle tone. Coordination normal.  Skin: Skin is warm and dry.  Psychiatric:  See mse below    Review of Systems  Constitutional: Positive for malaise/fatigue. Negative for fever, chills, weight loss and diaphoresis.  HENT: Negative for congestion, ear discharge, ear pain, hearing loss, nosebleeds, sore throat and tinnitus.   Eyes: Negative for blurred vision, double vision, photophobia, pain, discharge and redness.  Respiratory: Negative for cough, hemoptysis, sputum production, shortness of breath, wheezing and  stridor.   Cardiovascular: Negative for chest pain, palpitations, orthopnea, claudication, leg swelling and PND.  Gastrointestinal: Positive for heartburn. Negative for nausea, vomiting, diarrhea, constipation, blood in stool and melena.  Genitourinary: Negative for dysuria, urgency, frequency and hematuria.  Musculoskeletal: Negative for back pain, falls, joint pain, myalgias and neck pain.  Skin: Negative.   Neurological: Negative for dizziness, tingling, tremors, sensory change, speech change, focal weakness, seizures, weakness and headaches.  Endo/Heme/Allergies: Negative for polydipsia. Does not bruise/bleed easily.  Psychiatric/Behavioral: Positive for depression and memory loss (stress related). Negative for suicidal ideas, hallucinations and substance abuse. The patient is nervous/anxious and has insomnia.     Blood pressure 130/80, pulse 72, temperature 98.2 F (36.8 C), temperature source Oral, resp. rate 16, height '5\' 2"'  (1.575 m), weight 52.164 kg (115 lb).Body mass index is 21.03 kg/(m^2).  General Appearance: Fairly Groomed  Engineer, water::  Good  Speech:  Clear and Coherent  Volume:  Normal  Mood:  Dysphoric  Affect:  Congruent  Thought Process:  Coherent  Orientation:  Full (Time, Place, and Person)  Thought Content:  WDL  Suicidal Thoughts:  No  Homicidal Thoughts:  No  Memory:  Immediate;   Good  Judgement:  Good  Insight:  Fair  Psychomotor Activity:  Decreased  Concentration:  Fair  Recall:  [place] of Knowledge:Good  Language: Good  Akathisia:  NA  Handed:  Right  AIMS (if indicated):  NA  Assets:  Communication Skills Desire for Improvement Financial Resources/Insurance Housing Social Support Talents/Skills Vocational/Educational  Sleep:  Very disturbed/poor    Past Psychiatric History: Diagnosis:MDD  Hospitalizations:NONE REPORTED  Outpatient Care:WITH PCP  Substance Abuse Care:NA  Self-Mutilation:NA  Suicidal Attempts:NONE  Violent  Behaviors:NONE  Musculoskeletal: Strength & Muscle Tone: within normal limits Gait & Station: normal Patient leans: N/A  DSM5:  Schizophrenia Disorders:  NA Obsessive-Compulsive Disorders:  NA Trauma-Stressor Disorders:  NA Substance/Addictive Disorders:  NA Depressive Disorders:  Major Depressive Disorder - Moderate (296.22)  Axis Diagnosis:   AXIS I:  MDD MODERATE;SLEEP DISTURBANCE AXIS II:  Deferred AXIS III:   Past Medical History  Diagnosis Date  . GERD (gastroesophageal reflux disease)    AXIS IV:  occupational problems and problems related to social environment AXIS V:  41-50 serious symptoms  Level of Care:  IOP  Hospital Course:  PT WAS OBSERVED AND STABILIZED TO RETURN FOR IOP/INTERIM MEDS PRESCRIBED  Consults:  None  Significant Diagnostic Studies:  labs: ESSENTIALLY WNL  Discharge Vitals:   Blood pressure 130/80, pulse 72, temperature 98.2 F (36.8 C), temperature source Oral, resp. rate 16, height '5\' 2"'  (1.575 m), weight 52.164 kg (115 lb). Body mass index is 21.03 kg/(m^2). Lab Results:   Results for orders placed during the hospital encounter of 08/15/13 (from the past 72 hour(s))  URINE RAPID DRUG SCREEN (HOSP PERFORMED)     Status: None   Collection Time    08/15/13  2:43 PM      Result Value Ref Range   Opiates NONE DETECTED  NONE DETECTED   Cocaine NONE DETECTED  NONE DETECTED   Benzodiazepines NONE DETECTED  NONE DETECTED   Amphetamines NONE DETECTED  NONE DETECTED   Tetrahydrocannabinol NONE DETECTED  NONE DETECTED   Barbiturates NONE DETECTED  NONE DETECTED   Comment:            DRUG SCREEN FOR MEDICAL PURPOSES     ONLY.  IF CONFIRMATION IS NEEDED     FOR ANY PURPOSE, NOTIFY LAB     WITHIN 5 DAYS.                LOWEST DETECTABLE LIMITS     FOR URINE DRUG SCREEN     Drug Class       Cutoff (ng/mL)     Amphetamine      1000     Barbiturate      200     Benzodiazepine   540     Tricyclics       086     Opiates          300      Cocaine          300     THC              50  ACETAMINOPHEN LEVEL     Status: None   Collection Time    08/15/13  3:34 PM      Result Value Ref Range   Acetaminophen (Tylenol), Serum <15.0  10 - 30 ug/mL   Comment:            THERAPEUTIC CONCENTRATIONS VARY     SIGNIFICANTLY. A RANGE OF 10-30     ug/mL MAY BE AN EFFECTIVE     CONCENTRATION FOR MANY PATIENTS.     HOWEVER, SOME ARE BEST TREATED     AT CONCENTRATIONS OUTSIDE THIS     RANGE.     ACETAMINOPHEN CONCENTRATIONS     >150 ug/mL AT 4 HOURS AFTER     INGESTION AND >50 ug/mL AT 12     HOURS AFTER INGESTION ARE     OFTEN ASSOCIATED WITH TOXIC     REACTIONS.  CBC     Status:  None   Collection Time    08/15/13  3:34 PM      Result Value Ref Range   WBC 6.0  4.0 - 10.5 K/uL   RBC 4.37  3.87 - 5.11 MIL/uL   Hemoglobin 14.0  12.0 - 15.0 g/dL   HCT 38.9  36.0 - 46.0 %   MCV 89.0  78.0 - 100.0 fL   MCH 32.0  26.0 - 34.0 pg   MCHC 36.0  30.0 - 36.0 g/dL   RDW 13.1  11.5 - 15.5 %   Platelets 286  150 - 400 K/uL  COMPREHENSIVE METABOLIC PANEL     Status: None   Collection Time    08/15/13  3:34 PM      Result Value Ref Range   Sodium 138  137 - 147 mEq/L   Potassium 3.9  3.7 - 5.3 mEq/L   Chloride 99  96 - 112 mEq/L   CO2 27  19 - 32 mEq/L   Glucose, Bld 91  70 - 99 mg/dL   BUN 10  6 - 23 mg/dL   Creatinine, Ser 0.57  0.50 - 1.10 mg/dL   Calcium 10.0  8.4 - 10.5 mg/dL   Total Protein 6.6  6.0 - 8.3 g/dL   Albumin 4.0  3.5 - 5.2 g/dL   AST 20  0 - 37 U/L   ALT 17  0 - 35 U/L   Alkaline Phosphatase 53  39 - 117 U/L   Total Bilirubin 0.4  0.3 - 1.2 mg/dL   GFR calc non Af Amer >90  >90 mL/min   GFR calc Af Amer >90  >90 mL/min   Comment: (NOTE)     The eGFR has been calculated using the CKD EPI equation.     This calculation has not been validated in all clinical situations.     eGFR's persistently <90 mL/min signify possible Chronic Kidney     Disease.  ETHANOL     Status: None   Collection Time    08/15/13  3:34  PM      Result Value Ref Range   Alcohol, Ethyl (B) <11  0 - 11 mg/dL   Comment:            LOWEST DETECTABLE LIMIT FOR     SERUM ALCOHOL IS 11 mg/dL     FOR MEDICAL PURPOSES ONLY  SALICYLATE LEVEL     Status: Abnormal   Collection Time    08/15/13  3:34 PM      Result Value Ref Range   Salicylate Lvl <5.7 (*) 2.8 - 20.0 mg/dL  I-STAT TROPOININ, ED     Status: None   Collection Time    08/15/13  3:41 PM      Result Value Ref Range   Troponin i, poc 0.00  0.00 - 0.08 ng/mL   Comment 3            Comment: Due to the release kinetics of cTnI,     a negative result within the first hours     of the onset of symptoms does not rule out     myocardial infarction with certainty.     If myocardial infarction is still suspected,     repeat the test at appropriate intervals.    Physical Findings: AIMS: Facial and Oral Movements Muscles of Facial Expression: None, normal Lips and Perioral Area: None, normal Jaw: None, normal Tongue: None, normal,Extremity Movements Upper (arms, wrists, hands, fingers): None, normal Lower (  legs, knees, ankles, toes): None, normal, Trunk Movements Neck, shoulders, hips: None, normal, Overall Severity Severity of abnormal movements (highest score from questions above): None, normal Incapacitation due to abnormal movements: None, normal Patient's awareness of abnormal movements (rate only patient's report): No Awareness, Dental Status Current problems with teeth and/or dentures?: No Does patient usually wear dentures?: No  CIWA:    COWS:     Psychiatric Specialty Exam: See Psychiatric Specialty Exam and Suicide Risk Assessment completed by Attending Physician prior to discharge.  Discharge destination:  Other:  HOME  Is patient on multiple antipsychotic therapies at discharge:  No   Has Patient had three or more failed trials of antipsychotic monotherapy by history:  No  Recommended Plan for Multiple Antipsychotic Therapies: NA     Medication  List    STOP taking these medications       MELATONIN PO      TAKE these medications     Indication   ALPRAZolam 0.25 MG tablet  Commonly known as:  XANAX  Take 0.25 mg by mouth at bedtime as needed for anxiety.   Indication:  Feeling Anxious     azelastine 137 MCG/SPRAY nasal spray  Commonly known as:  ASTELIN  Place 2 sprays into both nostrils daily. Use in each nostril as directed      escitalopram 10 MG tablet  Commonly known as:  LEXAPRO  Take 1 tablet (10 mg total) by mouth daily.      omeprazole 40 MG capsule  Commonly known as:  PRILOSEC  Take 40 mg by mouth daily.   Indication:  GERD-Related Laryngitis     SYSTANE 0.4-0.3 % Soln  Generic drug:  Polyethyl Glycol-Propyl Glycol  Place 2 drops into both eyes 2 (two) times daily.      traZODone 50 MG tablet  Commonly known as:  DESYREL  - Start with 25 mg HS (1/2 tab) repeat if needed to sleep. If repeat necessaryThen take 1 tablet at bed time.  - May increase up to 2 tablets at bedtime for sleep as needed          Follow-up recommendations:  Activity:  AS TOLERATED Diet:  REGULAR Other:  MEDICATIONS PER ORDER  Comments:  Pt is mainly c/o of insomnia related to a 2 month hx of progressive depression being treated by her PCP GYN MD with Lorrin Mais and klonopin.She has an rx for Lexapro that started in the last week or two at 11m and today was increased to 10 mg. She has no suicidal thought/ideation/plans.She feels safe to go home .She is requesting appt Monday with therapist and/or Psychiatrsit.[facility] [place] [place] [facility] to [facility] and understands adjustments can be made in her followup treatments as she sees fit .She was prescribed Lexapro 143mand Trazodone 50 mg with 25-10031mose HS as needed and tolerated.  Total Discharge Time:  Greater than 30 minutes.  Signed: ChaDara Hoyer24/2015, 12:03 PM

## 2013-08-16 NOTE — Progress Notes (Signed)
Patient ID: Carol Mercado, female   DOB: [birth year], 63 y.o.   MRN: [id number] Pt anxious and comfortable this morning. She has been visited by her daughter. Patient continues to complain of unrelenting anxiety and depression. She is hopeful for d/c soon, but is unable to verbalize a sound D/C plan to manage her anxiety and depression. She continues to describe her personal and work life as being hopeless and she cannot see it improving. States she recently started her anti-depressant, but states she doesn't feel like it is helping her. Educated patient on expected outcomes r/t anti-depressants and encouraged patience with the medication as it takes weeks to months before we can establish the degree to which it is of benefit. Family feels she needs inpt treatment. She currently denies SI/HI and AVH. Will continue to monitor and developing discharge plan.

## 2013-08-16 NOTE — Progress Notes (Signed)
Pt discharged home. Husband, Daughter and SIL here to pick her up. She has outpt appointment for Tuesday at 08:45 with [facility]. Suicide risk assessment completed. She currently denies SI/HI and AVH. Suicide prevention pamphlet and AVS provided and reviewed. Pt verbalized understanding of AVS, Suicide prevention pamphlet and AVS. She offered no questions or concerns. She was escorted to lobby where her family was waiting and she left in no acute distress.

## 2013-08-16 NOTE — Discharge Instructions (Addendum)
You are scheduled to start the Mental Health Intensive Outpatient Program (MH-IOP) at the [facility] on Tuesday, 08/20/2013.  Please plan to be there at 8:45 am for the first session.  Have the completed intake paperwork provided by the Observation Unit with you.  The program meets Monday - Friday from 9:00 am - 12:00 pm.  If you have any questions, please contact Dellia Nims at [phone].

## 2013-08-16 NOTE — BH Assessment (Signed)
[facility] Assessment Progress Note  After consulting with Darlyne Russian, PA, I spoke with the pt about outpatient treatment.  Several options were discussed, and in the end it was decided that the best alternative was for pt to enroll in MH-IOP at [facility].  Pt is to start the program on Tuesday, 08/20/2013.  She was instructed to arrive at 8:45 am on the first day.  She was given printed information about the program, including Edwena Felty name and phone number.  I also provided intake paperwork for the Outpatient Clinic and advised her to complete it at home and to take it with her for the first session.  At the request of the pt and her family, I provided a written list of several area facilities that provide routine outpatient psychiatry and counseling.  Among these were the [facility] in [place] and [facility], [place].  This resource is to serve as a Optometrist.  Jalene Mullet, MA Triage Specialist 08/16/2013 @ 12:53

## 2013-08-20 ENCOUNTER — Other Ambulatory Visit (HOSPITAL_COMMUNITY): Payer: Commercial Managed Care - PPO | Attending: Psychiatry | Admitting: Psychiatry

## 2013-08-20 ENCOUNTER — Encounter (HOSPITAL_COMMUNITY): Payer: Self-pay

## 2013-08-20 DIAGNOSIS — F331 Major depressive disorder, recurrent, moderate: Secondary | ICD-10-CM

## 2013-08-20 DIAGNOSIS — F321 Major depressive disorder, single episode, moderate: Secondary | ICD-10-CM | POA: Insufficient documentation

## 2013-08-20 DIAGNOSIS — F411 Generalized anxiety disorder: Secondary | ICD-10-CM | POA: Insufficient documentation

## 2013-08-20 DIAGNOSIS — G4700 Insomnia, unspecified: Secondary | ICD-10-CM | POA: Insufficient documentation

## 2013-08-20 DIAGNOSIS — K219 Gastro-esophageal reflux disease without esophagitis: Secondary | ICD-10-CM | POA: Insufficient documentation

## 2013-08-20 MED ORDER — CHLORPROMAZINE HCL 25 MG PO TABS: 25.0000 mg | ORAL_TABLET | Freq: Every day | ORAL | Status: AC

## 2013-08-20 NOTE — Progress Notes (Signed)
    Daily Group Progress Note  Program: IOP  Group Time: 9:00-10:30  Participation Level: Active  Behavioral Response: Appropriate  Type of Therapy:  Group Therapy  Summary of Progress: Pt. Met with case manager and psychiatrist.     Group Time: 10:30-12:00  Participation Level:  Active  Behavioral Response: Appropriate  Type of Therapy: Psycho-education Group  Summary of Progress: Pt. Met with case manager and psychiatrist.  [facility]

## 2013-08-20 NOTE — Progress Notes (Signed)
Carol Mercado is a 63 y.o., married, Caucasian female, who was transitioned from an observation bed at [facility].  She was there overnight.  States that she is friends with Dr. Launa Flight and he had encouraged her to seek help at [facility].  Pt reports that she has been struggling with worsening depressive and anxiety symptoms since December 2014, but the symptoms have worsened the past three weeks.  Denies SI/HI or A/V hallucinations.  Other symptoms include poor memory and concentration, increased isolation, sleeping only 2-3 hrs, tearfulness, sadness, racing thoughts, decreased appetite (has lost 8 lbs within one month), and feelings of hopelessness/helplessness.  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  "I haven't been able to function.  I have so many responsibilities and duties.  I feel like I can't keep up with the latest technology."  Pt states that the head/principal is leaving, so there are a lot of changes going on.  2)  Finances:  Pt is worried that if she can't return to work, how will she and her husband pay their bills.  Husband is retired.  Pt has been the breadwinner.  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  Husband has health issues (Diabetes and HTN).  Pt dispenses his medications.  4)  Recent surgery:  In December 2014, pt broke her rt thumb while skiing.  Had to have surgery.  States it limited her being able to function at full capacity.   Denies any previous psychiatric hospitalizations.  Denies any prior suicide attempts.  Admits to seeing a therapist years ago after a divorce.  Family Hx:  Deceased father (Hx of depression.  Had ECT.)  Deceased mother (hx of depression and dementia). Childhood:  Born and raised in [place].  Father was depressed when pt was in high school and college.  Mother was very authoritarian.  States she did well in school.  "I was an A/B student."  Reports overall having a "happy childhood." Siblings:  Older sister who  lives in [place] and an older brother in [place].   Has been married to second husband for thirty years.  Was with first husband for seven years.  "We grew apart." Pt has a 68 yr old daughter (from first marriage) who resides in [place].  Also, has a 47 yr old stepson who resides in [place].   Pt denies any drugs/ETOH.   Reports that her support system includes her husband, daughter and friends/coworkers. Pt completed all forms.  Pt will attend MH-IOP for ten days.  A:  Oriented pt.  Provided pt with an orientation folder.  Informed Dr. Gaylan Gerold and Dr. Carol Ada (PCP) of admit.  Will refer pt to Dr. Clovis Pu.  Encouraged support groups.  R:  Pt receptive.

## 2013-08-21 ENCOUNTER — Other Ambulatory Visit (HOSPITAL_COMMUNITY): Payer: Commercial Managed Care - PPO | Attending: Psychiatry | Admitting: Psychiatry

## 2013-08-21 DIAGNOSIS — F331 Major depressive disorder, recurrent, moderate: Secondary | ICD-10-CM

## 2013-08-21 NOTE — Progress Notes (Signed)
Patient ID: Carol Mercado, female   DOB: [birth year], 63 y.o.   MRN: [id number] Patient was seen along with Dellia Nims case manager of IOP patient states that she took the Thorazine 25 mg and could not sleep continues to be extremely anxious. Very reluctant to increase the medications but after discussing cognitive behavior therapy patient is willing to increase the Thorazine to 50 mg at bedtime patient said comes from a family background of in perfection and has difficulty with his symptoms. No suicidal or homicidal ideation noted no hallucinations or delusions.

## 2013-08-22 ENCOUNTER — Other Ambulatory Visit (HOSPITAL_COMMUNITY): Payer: Commercial Managed Care - PPO | Admitting: Psychiatry

## 2013-08-22 ENCOUNTER — Encounter (HOSPITAL_COMMUNITY): Payer: Self-pay | Admitting: Psychiatry

## 2013-08-22 DIAGNOSIS — F331 Major depressive disorder, recurrent, moderate: Secondary | ICD-10-CM

## 2013-08-22 NOTE — Progress Notes (Signed)
    Daily Group Progress Note  Program: IOP  Group Time: 9:00-10:30  Participation Level: Minimal  Behavioral Response: Appropriate  Type of Therapy:  Group Therapy  Summary of Progress: Pt. Participated in meditation exercise. Pt. Reported that she was feeling "about the same" as yesterday with feelings of guilt related to inability to respond to family and friends. Pt. 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     Group Time: 10:30-12:00  Participation Level:  Minimal  Behavioral Response: Appropriate  Type of Therapy: Psycho-education Group  Summary of Progress: Pt. Participated in discussion about the effects of loneliness, failure, rejection, and rumination on emotional health.  Nancie Neas, COUNS

## 2013-08-22 NOTE — Progress Notes (Signed)
    Daily Group Progress Note  Program: IOP  Group Time: 9:00-10:30  Participation Level: Active  Behavioral Response: Appropriate  Type of Therapy:  Group Therapy  Summary of Progress: Pt. Participated in meditation exercise. Pt. Reported patterns of perfectionism in her work life, feelings of guilt regarding family providing care for her during her crisis, concerns about husband who does not understand her depression.     Group Time: 10:30-12:00  Participation Level:  Active  Behavioral Response: Appropriate  Type of Therapy: Psycho-education Group  Summary of Progress: Pt. Participated in group discussion about identifying cognitive distortions.  Nancie Neas, COUNS

## 2013-08-23 ENCOUNTER — Other Ambulatory Visit (HOSPITAL_COMMUNITY): Payer: Commercial Managed Care - PPO | Attending: Psychiatry | Admitting: Psychiatry

## 2013-08-23 ENCOUNTER — Encounter (HOSPITAL_COMMUNITY): Payer: Self-pay | Admitting: Psychiatry

## 2013-08-23 DIAGNOSIS — K219 Gastro-esophageal reflux disease without esophagitis: Secondary | ICD-10-CM | POA: Insufficient documentation

## 2013-08-23 DIAGNOSIS — F411 Generalized anxiety disorder: Secondary | ICD-10-CM | POA: Insufficient documentation

## 2013-08-23 DIAGNOSIS — F331 Major depressive disorder, recurrent, moderate: Secondary | ICD-10-CM

## 2013-08-23 DIAGNOSIS — F321 Major depressive disorder, single episode, moderate: Secondary | ICD-10-CM | POA: Insufficient documentation

## 2013-08-23 DIAGNOSIS — G4700 Insomnia, unspecified: Secondary | ICD-10-CM | POA: Insufficient documentation

## 2013-08-23 NOTE — Progress Notes (Signed)
Psychiatric Assessment Adult  Patient Identification:  Carol Mercado Date of Evaluation:  08/20/13 Chief Complaint: Depression and anxiety  History of Chief Complaint:  63 y.o., married, Caucasian female, who was transitioned from an observation bed at [facility]. She was there overnight. States that she is friends with Dr. Launa Flight and he had encouraged her to seek help at [facility]. Pt reports that she has been struggling with worsening depressive and anxiety symptoms since December 2014, but the symptoms have worsened the past three weeks. Denies SI/HI or A/V hallucinations. Other symptoms include poor memory and concentration, increased isolation, sleeping only 2-3 hrs, tearfulness, sadness, racing thoughts, decreased appetite (has lost 8 lbs within one month), and feelings of hopelessness/helplessness. 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 "I haven't been able to function. I have so many responsibilities and duties. I feel like I can't keep up with the latest technology." Pt states that the head/principal is leaving, so there are a lot of changes going on. 2) Finances: Pt is worried that if she can't return to work, how will she and her husband pay their bills. Husband is retired. Pt has been the breadwinner. 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 Husband has health issues (Diabetes and HTN). Pt dispenses his medications. 4) Recent surgery: In December 2014, pt broke her rt thumb while skiing. Had to have surgery. States it limited her being able to function at full capacity.  Denies any previous psychiatric hospitalizations. Denies any prior suicide attempts. Admits to seeing a therapist years ago after a divorce. Family Hx: Deceased father (Hx of depression. Had ECT.) Deceased mother (hx of depression and dementia).  Childhood: Born and raised in [place]. Father was depressed when pt was in high school and college. Mother was very authoritarian. States she  did well in school. "I was an A/B student." Reports overall having a "happy childhood."  Siblings: Older sister who lives in [place] and an older brother in [place].  Has been married to second husband for thirty years. Was with first husband for seven years. "We grew apart."  Pt has a 26 yr old daughter (from first marriage) who resides in [place]. Also, has a 20 yr old stepson who resides in [place].  Pt denies any drugs/ETOH.  Reports that her support system includes her husband, daughter and friends/coworkers. He and  HPI Review of Systems Physical Exam  Depressive Symptoms: depressed mood, anhedonia, insomnia, psychomotor retardation, fatigue, feelings of worthlessness/guilt, difficulty concentrating, hopelessness, impaired memory, anxiety, hypersomnia, weight loss, decreased appetite,  (Hypo) Manic Symptoms:  None  Anxiety Symptoms: Excessive Worry:  Yes Panic Symptoms:  No Agoraphobia:  No Obsessive Compulsive: No  Symptoms: None, Specific Phobias:  No Social Anxiety:  Yes  Psychotic Symptoms: None  PTSD Symptoms: None  Traumatic Brain Injury: No   Past Psychiatric History: Diagnosis: Depression and anxiety   Hospitalizations: : [place] [facility] unit last week   Outpatient Care: Has seen a therapist after her divorce many years ago   Substance Abuse Care:   Self-Mutilation:   Suicidal Attempts:   Violent Behaviors:    Past Medical History:   Past Medical History  Diagnosis Date  . GERD (gastroesophageal reflux disease)   . Anxiety   . Depression    History of Loss of Consciousness:  No Seizure History:  No Cardiac History:  No Allergies:   Allergies  Allergen Reactions  . Aspartame And Phenylalanine Other (See Comments)    Headaches  Current Medications:  Current Outpatient Prescriptions  Medication Sig Dispense Refill  . ALPRAZolam (XANAX) 0.25 MG tablet Take 0.25 mg by mouth at bedtime as needed for anxiety.       Marland Kitchen azelastine (ASTELIN) 137  MCG/SPRAY nasal spray Place 2 sprays into both nostrils daily. Use in each nostril as directed      . chlorproMAZINE (THORAZINE) 25 MG tablet Take 1 tablet (25 mg total) by mouth at bedtime.  30 tablet  0  . escitalopram (LEXAPRO) 10 MG tablet Take 1 tablet (10 mg total) by mouth daily.  30 tablet  1  . omeprazole (PRILOSEC) 40 MG capsule Take 40 mg by mouth daily.      Vladimir Faster Glycol-Propyl Glycol (SYSTANE) 0.4-0.3 % SOLN Place 2 drops into both eyes 2 (two) times daily.      . traZODone (DESYREL) 50 MG tablet Start with 25 mg HS (1/2 tab) repeat if needed to sleep. If repeat necessaryThen take 1 tablet at bed time. May increase up to 2 tablets at bedtime for sleep as needed  60 tablet  1   No current facility-administered medications for this visit.    Previous Psychotropic Medications:  Medication Dose   Ambien, Xanax, Klonopin for anxiety                        Substance Abuse History in the last 12 months: Not applicable Substance Age of 1st Use Last Use Amount Specific Type  Nicotine      Alcohol      Cannabis      Opiates      Cocaine      Methamphetamines      LSD      Ecstasy      Benzodiazepines      Caffeine      Inhalants      Others:                          Medical Consequences of Substance Abuse: None  Legal Consequences of Substance Abuse: None  Family Consequences of Substance Abuse: None  Blackouts:  No DT's:  No Withdrawal Symptoms:  No None  Social History: Current Place of Residence[place] of Birth:  Family Members:  Marital Status:  Married Children:   Sons:   Daughters: Relationships:  Education:  Soil scientist Problems/Performance:  Religious Beliefs/Practices:  History of Abuse: none Ship broker History:  None. Legal History:   none Hobbies/Interests:   Family History:   Family History  Problem Relation Age of Onset  . Depression Mother   . Depression Father     Mental  Status Examination/Evaluation: Objective:  Appearance: Casual  Eye Contact::  Good  Speech:  Clear and Coherent and Normal Rate language is normal   Volume:  Normal  Mood:  Depressed and anxious   Affect:  Constricted and Depressed  Thought Process:  Goal Directed and Linear, recent and remote memory is good   Orientation:  Full (Time, Place, and Person) concentration is fair   Thought Content:  Rumination  Suicidal Thoughts:  No  Homicidal Thoughts:  No  Judgement:  Fair  Insight:  Fair  Psychomotor Activity:  Normal musculoskeletal is normal   Akathisia:  No  Handed:  Right  AIMS (if indicated):  0  Assets:  Communication Skills Desire for Improvement Physical Health Resilience Social Support    Laboratory/X-Ray Psychological Evaluation(s)  AXIS I Generalized Anxiety Disorder and Major Depression, single episode  AXIS II Cluster C Traits  AXIS III Past Medical History  Diagnosis Date  . GERD (gastroesophageal reflux disease)   . Anxiety   . Depression      AXIS IV occupational problems, other psychosocial or environmental problems, problems related to social environment and problems with primary support group  AXIS V 51-60 moderate symptoms   Treatment Plan/Recommendations:  Plan of Care: Start IOP   Laboratory:  None at this time  Psychotherapy: Group and individual therapy   Medications: Discussed increasing Lexapro 20 mg every day and DC trazodone and Xanax. I discussed the rationale risks benefits options of Thorazine 25 mg for insomnia and patient gave me her informed consent. She'll start Thorazine 25 mg each bedtime tonight.   Routine PRN Medications:  Yes  Consultations:   Safety Concerns:  None   Other:  Estimated length of stay 2 weeks     Leonides Grills, MD 5/1/201512:04 PM

## 2013-08-23 NOTE — Progress Notes (Signed)
    Daily Group Progress Note  Program: IOP  Group Time: 9:00-10:30  Participation Level: Minimal  Behavioral Response: Appropriate  Type of Therapy:  Group Therapy  Summary of Progress: Pt. Participated in meditation exercise. Pt. Reported that she continues to struggle with insomnia and racing thoughts. Pt. Reported that lost pleasure in eating which she attributes to medication.     Group Time: 10:30-12:00  Participation Level:  Minimal  Behavioral Response: Appropriate  Type of Therapy: Psycho-education Group  Summary of Progress: Pt. Participated in discussion about the neuropsychology of laughter and how laughter can be used as an emotion regulation tool.  Nancie Neas, COUNS

## 2013-08-26 ENCOUNTER — Other Ambulatory Visit (HOSPITAL_COMMUNITY): Payer: Commercial Managed Care - PPO

## 2013-08-27 ENCOUNTER — Other Ambulatory Visit (HOSPITAL_COMMUNITY): Payer: Commercial Managed Care - PPO

## 2013-08-27 NOTE — Discharge Summary (Signed)
Case discussed, agree with plan 

## 2013-08-28 ENCOUNTER — Other Ambulatory Visit (HOSPITAL_COMMUNITY): Payer: Commercial Managed Care - PPO

## 2013-08-28 ENCOUNTER — Telehealth (HOSPITAL_COMMUNITY): Payer: Self-pay | Admitting: Psychiatry

## 2013-08-28 NOTE — Patient Instructions (Signed)
Pt decided not to return to [place].  F/U with Dr. Clovis Pu on 10-09-13 @ 10:30 am; will contact Dr. Gaylan Gerold for f/u appt.  Encouraged support groups.

## 2013-08-28 NOTE — Progress Notes (Unsigned)
Carol Mercado is a 63 y.o. married, Caucasian female, who was transitioned from an observation bed at [facility]. She was there overnight. States that she is friends with Dr. Launa Flight and he had encouraged her to seek help at [facility]. Pt reported that she had been struggling with worsening depressive and anxiety symptoms since December 2014, but the symptoms had worsened the past three weeks. Denied SI/HI or A/V hallucinations. Other symptoms included poor memory and concentration, increased isolation, sleeping only 2-3 hrs, tearfulness, sadness, racing thoughts, decreased appetite (has lost 8 lbs within one month), and feelings of hopelessness/helplessness. 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 "I haven't been able to function. I have so many responsibilities and duties. I feel like I can't keep up with the latest technology." Pt states that the head/principal is leaving, so there are a lot of changes going on. 2) Finances: Pt is worried that if she can't return to work, how will she and her husband pay their bills. Husband is retired. Pt has been the breadwinner. 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 Husband has health issues (Diabetes and HTN). Pt dispenses his medications. 4) Recent surgery: In December 2014, pt broke her rt thumb while skiing. Had to have surgery. States it limited her being able to function at full capacity.  Denied any previous psychiatric hospitalizations. Denied any prior suicide attempts. Admitted to seeing a therapist years ago after a divorce. Family Hx: Deceased father (Hx of depression. Had ECT.) Deceased mother (hx of depression and dementia).  Childhood: Born and raised in [place]. Father was depressed when pt was in high school and college. Mother was very authoritarian. States she did well in school. "I was an A/B student." Reports overall having a "happy childhood."  Siblings: Older sister who lives in [place] and an older brother in [place].    Pt decided not to return to [place] after going to visit with daughter last weekend.  According to pt's husband, pt decided to continue to visit with daughter in [place].  This writer attempted to contact pt, but she didn't answer her cell phone.  Left vm for her to call pt, in order to finalize discharge plans.  A:  D/C today.  F/U with Dr. Clovis Pu on 10-09-13 @ 10:30 a.m.; pt to also f/u with Dr. Gaylan Gerold.  Encourage support groups.

## 2013-08-29 ENCOUNTER — Other Ambulatory Visit (HOSPITAL_COMMUNITY): Payer: Commercial Managed Care - PPO

## 2013-08-29 NOTE — Progress Notes (Signed)
Discharge Note  Patient:  Carol Mercado is an 63 y.o., female DOB:  [birth year]  Date of Admission:  08/20/13  Date of Discharge:  08/26/13  Reason for Admission: 63 y.o. married, Caucasian female, who was transitioned from an observation bed at [facility]. She was there overnight. States that she is friends with Dr. Launa Flight and he had encouraged her to seek help at [facility]. Pt reported that she had been struggling with worsening depressive and anxiety symptoms since December 2014, but the symptoms had worsened the past three weeks. Denied SI/HI or A/V hallucinations. Other symptoms included poor memory and concentration, increased isolation, sleeping only 2-3 hrs, tearfulness, sadness, racing thoughts, decreased appetite (has lost 8 lbs within one month), and feelings of hopelessness/helplessness. 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 "I haven't been able to function. I have so many responsibilities and duties. I feel like I can't keep up with the latest technology." Pt states that the head/principal is leaving, so there are a lot of changes going on. 2) Finances: Pt is worried that if she can't return to work, how will she and her husband pay their bills. Husband is retired. Pt has been the breadwinner. 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 Husband has health issues (Diabetes and HTN). Pt dispenses his medications. 4) Recent surgery: In December 2014, pt broke her rt thumb while skiing. Had to have surgery. States it limited her being able to function at full capacity.  Denied any previous psychiatric hospitalizations. Denied any prior suicide attempts. Admitted to seeing a therapist years ago after a divorce. Family Hx: Deceased father (Hx of depression. Had ECT.) Deceased mother (hx of depression and dementia).  Childhood: Born and raised in [place]. Father was depressed when pt was in high school and college. Mother was very authoritarian. States she did well  in school. "I was an A/B student." Reports overall having a "happy childhood."  Siblings: Older sister who lives in [place] and an older brother in [place].    Hospital course,   patient started IOP and her trazodone and Xanax was discontinued. As she had not been taking them. Her Lexapro was increased to 20 mg every day. Patient was started on Thorazine 25 mg for insomnia and this helped her. She then decided to go visit her daughter in [name].  Pt decided not to return to [place] after going to visit with daughter last weekend. According to pt's husband, pt decided to continue to visit with daughter in [place]. This writer attempted to contact pt, but she didn't answer her cell phone. Left vm for her to call pt, in order to finalize discharge plans. A: D/C today. F/U with Dr. Clovis Pu on 10-09-13 @ 10:30 a.m.; pt to also f/u with Dr. Gaylan Gerold. Encourage support groups.    Mental Status at Discharge: Done via the phone, patient is alert oriented x3, affect is appropriate mood is anxious speech is normal language is normal no suicidal or homicidal ideation, no hallucinations or delusions. Recent and remote memory was good, concentration and recall were good judgment and insight was also put.   Lab Results: No results found for this or any previous visit (from the past 48 hour(s)).  Current outpatient prescriptions:ALPRAZolam (XANAX) 0.25 MG tablet, Take 0.25 mg by mouth at bedtime as needed for anxiety. , Disp: , Rfl: ;  azelastine (ASTELIN) 137 MCG/SPRAY nasal spray, Place 2 sprays into both nostrils daily. Use in each nostril as directed, Disp: , Rfl: ;  chlorproMAZINE (THORAZINE) 25 MG tablet, Take 1 tablet (25 mg total) by mouth at bedtime., Disp: 30 tablet, Rfl: 0 escitalopram (LEXAPRO) 10 MG tablet, Take 1 tablet (10 mg total) by mouth daily., Disp: 30 tablet, Rfl: 1;  omeprazole (PRILOSEC) 40 MG capsule, Take 40 mg by mouth daily., Disp: , Rfl: ;  Polyethyl Glycol-Propyl Glycol (SYSTANE) 0.4-0.3 % SOLN, Place 2  drops into both eyes 2 (two) times daily., Disp: , Rfl:  traZODone (DESYREL) 50 MG tablet, Start with 25 mg HS (1/2 tab) repeat if needed to sleep. If repeat necessaryThen take 1 tablet at bed time. May increase up to 2 tablets at bedtime for sleep as needed, Disp: 60 tablet, Rfl: 1  Trazodone was discontinued    Axis Diagnosis:   Axis I: Generalized Anxiety Disorder and Major Depression, single episode Axis II: Deferred Axis III:  Past Medical History  Diagnosis Date  . GERD (gastroesophageal reflux disease)   . Anxiety   . Depression    Axis IV: other psychosocial or environmental problems, problems related to social environment and problems with primary support group Axis V: 61-70 mild symptoms   Level of Care:  OP  Discharge destination:  Home  Is patient on multiple antipsychotic therapies at discharge:  No    Has Patient had three or more failed trials of antipsychotic monotherapy by history:  No  Patient phone:  [phone] (home)  Patient address:   [place] [address],   Follow-up recommendations:  Activity:  as tolerated Diet:  Regular Other:  Followup for medications and therapy as scheduled in [name] has patient has decided to stay there  Comments:  None  The patient received suicide prevention pamphlet:  yes    Leonides Grills 08/29/2013, 12:53 PM

## 2013-08-30 ENCOUNTER — Other Ambulatory Visit (HOSPITAL_COMMUNITY): Payer: Commercial Managed Care - PPO

## 2013-09-02 ENCOUNTER — Other Ambulatory Visit (HOSPITAL_COMMUNITY): Payer: Commercial Managed Care - PPO

## 2013-09-03 ENCOUNTER — Other Ambulatory Visit (HOSPITAL_COMMUNITY): Payer: Commercial Managed Care - PPO

## 2013-09-04 ENCOUNTER — Other Ambulatory Visit (HOSPITAL_COMMUNITY): Payer: Commercial Managed Care - PPO

## 2013-09-05 ENCOUNTER — Other Ambulatory Visit (HOSPITAL_COMMUNITY): Payer: Commercial Managed Care - PPO

## 2013-09-06 ENCOUNTER — Other Ambulatory Visit (HOSPITAL_COMMUNITY): Payer: Commercial Managed Care - PPO

## 2013-09-09 ENCOUNTER — Other Ambulatory Visit (HOSPITAL_COMMUNITY): Payer: Commercial Managed Care - PPO

## 2013-09-10 ENCOUNTER — Other Ambulatory Visit (HOSPITAL_COMMUNITY): Payer: Commercial Managed Care - PPO

## 2014-01-01 DIAGNOSIS — F331 Major depressive disorder, recurrent, moderate: Secondary | ICD-10-CM | POA: Diagnosis not present

## 2014-01-27 DIAGNOSIS — Z23 Encounter for immunization: Secondary | ICD-10-CM | POA: Diagnosis not present

## 2014-01-27 DIAGNOSIS — M810 Age-related osteoporosis without current pathological fracture: Secondary | ICD-10-CM | POA: Diagnosis not present

## 2014-03-10 DIAGNOSIS — F331 Major depressive disorder, recurrent, moderate: Secondary | ICD-10-CM | POA: Diagnosis not present

## 2014-05-05 DIAGNOSIS — F329 Major depressive disorder, single episode, unspecified: Secondary | ICD-10-CM | POA: Diagnosis not present

## 2014-05-05 DIAGNOSIS — Z13220 Encounter for screening for lipoid disorders: Secondary | ICD-10-CM | POA: Diagnosis not present

## 2014-05-05 DIAGNOSIS — Z0000 Encounter for general adult medical examination without abnormal findings: Secondary | ICD-10-CM | POA: Diagnosis not present

## 2014-05-05 DIAGNOSIS — R740 Nonspecific elevation of levels of transaminase and lactic acid dehydrogenase [LDH]: Secondary | ICD-10-CM | POA: Diagnosis not present

## 2014-05-05 DIAGNOSIS — M810 Age-related osteoporosis without current pathological fracture: Secondary | ICD-10-CM | POA: Diagnosis not present

## 2014-05-15 ENCOUNTER — Other Ambulatory Visit: Payer: Self-pay

## 2014-05-15 DIAGNOSIS — Z1231 Encounter for screening mammogram for malignant neoplasm of breast: Secondary | ICD-10-CM

## 2014-06-03 DIAGNOSIS — F331 Major depressive disorder, recurrent, moderate: Secondary | ICD-10-CM | POA: Diagnosis not present

## 2014-06-13 ENCOUNTER — Ambulatory Visit: Payer: Self-pay

## 2014-06-16 ENCOUNTER — Ambulatory Visit
Admission: RE | Admit: 2014-06-16 | Discharge: 2014-06-16 | Disposition: A | Discharge status: Home / Self Care | Payer: Medicare Other | Source: Ambulatory Visit

## 2014-06-16 DIAGNOSIS — Z1231 Encounter for screening mammogram for malignant neoplasm of breast: Secondary | ICD-10-CM

## 2014-06-30 DIAGNOSIS — J029 Acute pharyngitis, unspecified: Secondary | ICD-10-CM | POA: Diagnosis not present

## 2014-06-30 DIAGNOSIS — J069 Acute upper respiratory infection, unspecified: Secondary | ICD-10-CM | POA: Diagnosis not present

## 2014-08-26 DIAGNOSIS — F331 Major depressive disorder, recurrent, moderate: Secondary | ICD-10-CM | POA: Diagnosis not present

## 2014-10-14 DIAGNOSIS — H3531 Nonexudative age-related macular degeneration: Secondary | ICD-10-CM | POA: Diagnosis not present

## 2014-12-01 DIAGNOSIS — F331 Major depressive disorder, recurrent, moderate: Secondary | ICD-10-CM | POA: Diagnosis not present

## 2014-12-02 DIAGNOSIS — Z800 Family history of malignant neoplasm of digestive organs: Secondary | ICD-10-CM | POA: Diagnosis not present

## 2014-12-02 DIAGNOSIS — Z86010 Personal history of colonic polyps: Secondary | ICD-10-CM | POA: Diagnosis not present

## 2014-12-02 DIAGNOSIS — K640 First degree hemorrhoids: Secondary | ICD-10-CM | POA: Diagnosis not present

## 2014-12-02 DIAGNOSIS — Z09 Encounter for follow-up examination after completed treatment for conditions other than malignant neoplasm: Secondary | ICD-10-CM | POA: Diagnosis not present

## 2015-01-12 DIAGNOSIS — H10413 Chronic giant papillary conjunctivitis, bilateral: Secondary | ICD-10-CM | POA: Diagnosis not present

## 2015-01-23 DIAGNOSIS — Z23 Encounter for immunization: Secondary | ICD-10-CM | POA: Diagnosis not present

## 2015-05-07 DIAGNOSIS — Z0000 Encounter for general adult medical examination without abnormal findings: Secondary | ICD-10-CM | POA: Diagnosis not present

## 2015-05-07 DIAGNOSIS — K7581 Nonalcoholic steatohepatitis (NASH): Secondary | ICD-10-CM | POA: Diagnosis not present

## 2015-05-07 DIAGNOSIS — F329 Major depressive disorder, single episode, unspecified: Secondary | ICD-10-CM | POA: Diagnosis not present

## 2015-05-07 DIAGNOSIS — Z23 Encounter for immunization: Secondary | ICD-10-CM | POA: Diagnosis not present

## 2015-05-07 DIAGNOSIS — E7800 Pure hypercholesterolemia, unspecified: Secondary | ICD-10-CM | POA: Diagnosis not present

## 2015-05-20 DIAGNOSIS — F331 Major depressive disorder, recurrent, moderate: Secondary | ICD-10-CM | POA: Diagnosis not present

## 2015-05-27 DIAGNOSIS — D225 Melanocytic nevi of trunk: Secondary | ICD-10-CM | POA: Diagnosis not present

## 2015-05-27 DIAGNOSIS — D485 Neoplasm of uncertain behavior of skin: Secondary | ICD-10-CM | POA: Diagnosis not present

## 2015-05-27 DIAGNOSIS — D2271 Melanocytic nevi of right lower limb, including hip: Secondary | ICD-10-CM | POA: Diagnosis not present

## 2015-05-27 DIAGNOSIS — L918 Other hypertrophic disorders of the skin: Secondary | ICD-10-CM | POA: Diagnosis not present

## 2015-05-27 DIAGNOSIS — D1801 Hemangioma of skin and subcutaneous tissue: Secondary | ICD-10-CM | POA: Diagnosis not present

## 2015-05-27 DIAGNOSIS — L821 Other seborrheic keratosis: Secondary | ICD-10-CM | POA: Diagnosis not present

## 2015-05-27 DIAGNOSIS — D2239 Melanocytic nevi of other parts of face: Secondary | ICD-10-CM | POA: Diagnosis not present

## 2015-06-01 IMAGING — CR DG CHEST 2V
2 series · 2 of 2 positions shown · non-contrast
Comparison: None.

CLINICAL DATA: MEDICAL CLEARANCE

EXAM:
CHEST  2 VIEW

[w chest pa]
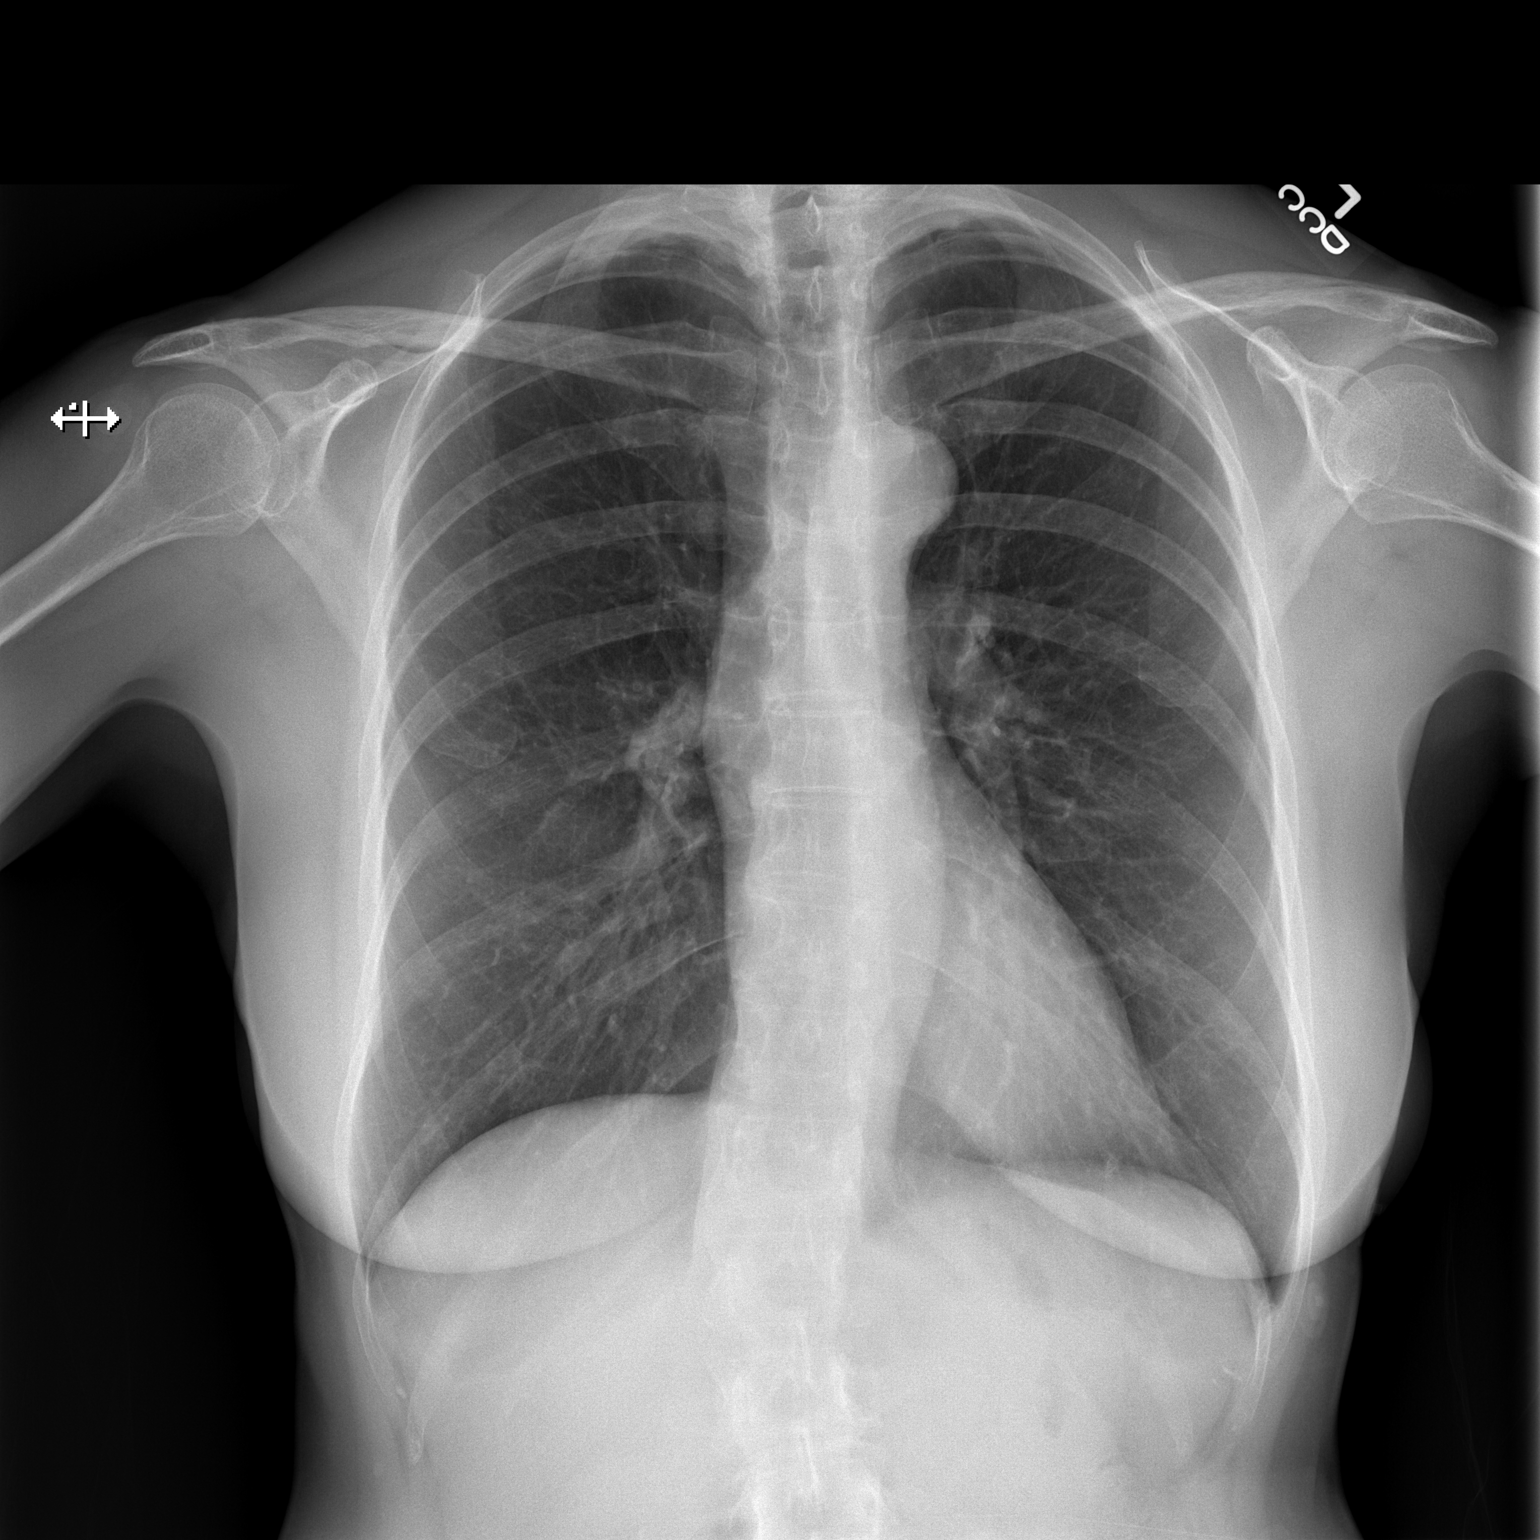

[w chest lat]
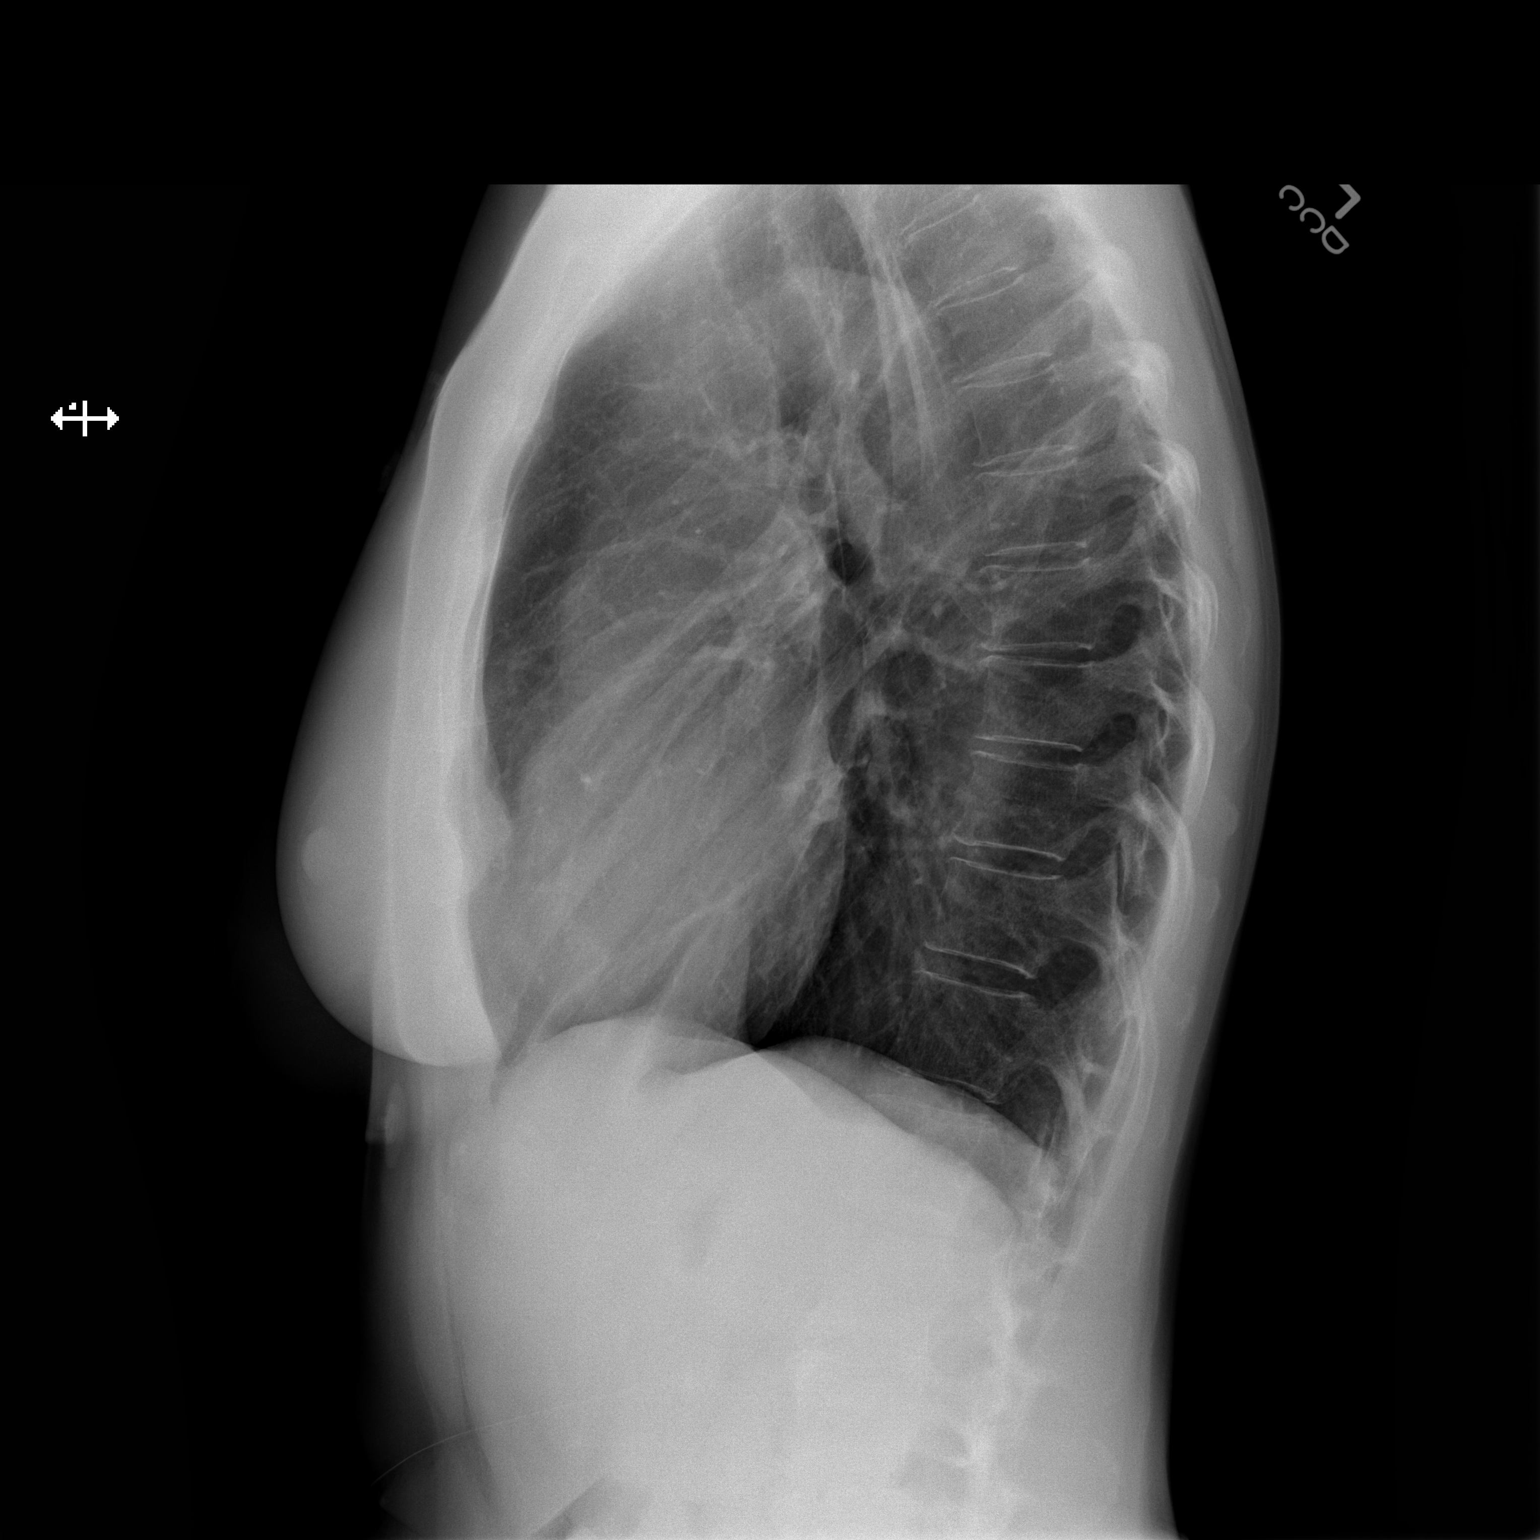

[2 of 2 positions shown; findings below may reference images not displayed]

FINDINGS: The heart size and mediastinal contours are within normal limits.
Both lungs are clear. S-shaped scoliosis in the thoracolumbar spine.
IMPRESSION: No active cardiopulmonary disease.

## 2015-06-03 DIAGNOSIS — Z4802 Encounter for removal of sutures: Secondary | ICD-10-CM | POA: Diagnosis not present

## 2015-06-08 DIAGNOSIS — H04123 Dry eye syndrome of bilateral lacrimal glands: Secondary | ICD-10-CM | POA: Diagnosis not present

## 2015-07-06 ENCOUNTER — Other Ambulatory Visit: Payer: Self-pay

## 2015-07-06 DIAGNOSIS — H10413 Chronic giant papillary conjunctivitis, bilateral: Secondary | ICD-10-CM | POA: Diagnosis not present

## 2015-07-06 DIAGNOSIS — Z1231 Encounter for screening mammogram for malignant neoplasm of breast: Secondary | ICD-10-CM

## 2015-07-06 DIAGNOSIS — H04123 Dry eye syndrome of bilateral lacrimal glands: Secondary | ICD-10-CM | POA: Diagnosis not present

## 2015-07-24 ENCOUNTER — Ambulatory Visit
Admission: RE | Admit: 2015-07-24 | Discharge: 2015-07-24 | Disposition: A | Discharge status: Home / Self Care | Payer: Medicare Other | Source: Ambulatory Visit

## 2015-07-24 ENCOUNTER — Ambulatory Visit: Payer: Self-pay

## 2015-07-24 DIAGNOSIS — Z1231 Encounter for screening mammogram for malignant neoplasm of breast: Secondary | ICD-10-CM | POA: Diagnosis not present

## 2015-12-02 DIAGNOSIS — H353111 Nonexudative age-related macular degeneration, right eye, early dry stage: Secondary | ICD-10-CM | POA: Diagnosis not present

## 2016-02-25 DIAGNOSIS — Z23 Encounter for immunization: Secondary | ICD-10-CM | POA: Diagnosis not present

## 2016-05-10 DIAGNOSIS — Z0000 Encounter for general adult medical examination without abnormal findings: Secondary | ICD-10-CM | POA: Diagnosis not present

## 2016-05-10 DIAGNOSIS — K219 Gastro-esophageal reflux disease without esophagitis: Secondary | ICD-10-CM | POA: Diagnosis not present

## 2016-05-10 DIAGNOSIS — M810 Age-related osteoporosis without current pathological fracture: Secondary | ICD-10-CM | POA: Diagnosis not present

## 2016-05-10 DIAGNOSIS — F419 Anxiety disorder, unspecified: Secondary | ICD-10-CM | POA: Diagnosis not present

## 2016-05-10 DIAGNOSIS — K7581 Nonalcoholic steatohepatitis (NASH): Secondary | ICD-10-CM | POA: Diagnosis not present

## 2016-05-10 DIAGNOSIS — F329 Major depressive disorder, single episode, unspecified: Secondary | ICD-10-CM | POA: Diagnosis not present

## 2016-05-10 DIAGNOSIS — Z1389 Encounter for screening for other disorder: Secondary | ICD-10-CM | POA: Diagnosis not present

## 2016-05-10 DIAGNOSIS — E7800 Pure hypercholesterolemia, unspecified: Secondary | ICD-10-CM | POA: Diagnosis not present

## 2016-06-27 ENCOUNTER — Other Ambulatory Visit: Payer: Self-pay | Admitting: Family Medicine

## 2016-06-27 DIAGNOSIS — M5136 Other intervertebral disc degeneration, lumbar region: Secondary | ICD-10-CM | POA: Diagnosis not present

## 2016-06-27 DIAGNOSIS — Z1231 Encounter for screening mammogram for malignant neoplasm of breast: Secondary | ICD-10-CM

## 2016-06-28 DIAGNOSIS — M810 Age-related osteoporosis without current pathological fracture: Secondary | ICD-10-CM | POA: Diagnosis not present

## 2016-07-06 DIAGNOSIS — M5136 Other intervertebral disc degeneration, lumbar region: Secondary | ICD-10-CM | POA: Diagnosis not present

## 2016-07-11 DIAGNOSIS — M810 Age-related osteoporosis without current pathological fracture: Secondary | ICD-10-CM | POA: Diagnosis not present

## 2016-07-20 DIAGNOSIS — M5136 Other intervertebral disc degeneration, lumbar region: Secondary | ICD-10-CM | POA: Diagnosis not present

## 2016-08-02 ENCOUNTER — Ambulatory Visit
Admission: RE | Admit: 2016-08-02 | Discharge: 2016-08-02 | Disposition: A | Discharge status: Home / Self Care | Payer: PPO | Source: Ambulatory Visit | Attending: Family Medicine | Admitting: Family Medicine

## 2016-08-02 DIAGNOSIS — Z1231 Encounter for screening mammogram for malignant neoplasm of breast: Secondary | ICD-10-CM

## 2016-08-17 DIAGNOSIS — D225 Melanocytic nevi of trunk: Secondary | ICD-10-CM | POA: Diagnosis not present

## 2016-08-17 DIAGNOSIS — L821 Other seborrheic keratosis: Secondary | ICD-10-CM | POA: Diagnosis not present

## 2016-08-17 DIAGNOSIS — D1801 Hemangioma of skin and subcutaneous tissue: Secondary | ICD-10-CM | POA: Diagnosis not present

## 2016-11-30 DIAGNOSIS — H3530 Unspecified macular degeneration: Secondary | ICD-10-CM | POA: Diagnosis not present

## 2017-05-16 DIAGNOSIS — F325 Major depressive disorder, single episode, in full remission: Secondary | ICD-10-CM | POA: Diagnosis not present

## 2017-05-16 DIAGNOSIS — E7800 Pure hypercholesterolemia, unspecified: Secondary | ICD-10-CM | POA: Diagnosis not present

## 2017-05-16 DIAGNOSIS — M810 Age-related osteoporosis without current pathological fracture: Secondary | ICD-10-CM | POA: Diagnosis not present

## 2017-05-16 DIAGNOSIS — Z0000 Encounter for general adult medical examination without abnormal findings: Secondary | ICD-10-CM | POA: Diagnosis not present

## 2017-05-16 DIAGNOSIS — K7581 Nonalcoholic steatohepatitis (NASH): Secondary | ICD-10-CM | POA: Diagnosis not present

## 2017-05-16 DIAGNOSIS — Z1389 Encounter for screening for other disorder: Secondary | ICD-10-CM | POA: Diagnosis not present

## 2017-05-16 DIAGNOSIS — K219 Gastro-esophageal reflux disease without esophagitis: Secondary | ICD-10-CM | POA: Diagnosis not present

## 2017-06-23 ENCOUNTER — Other Ambulatory Visit: Payer: Self-pay | Admitting: Family Medicine

## 2017-06-23 DIAGNOSIS — Z1231 Encounter for screening mammogram for malignant neoplasm of breast: Secondary | ICD-10-CM

## 2017-06-30 DIAGNOSIS — H1851 Endothelial corneal dystrophy: Secondary | ICD-10-CM | POA: Diagnosis not present

## 2017-06-30 DIAGNOSIS — H1045 Other chronic allergic conjunctivitis: Secondary | ICD-10-CM | POA: Diagnosis not present

## 2017-06-30 DIAGNOSIS — H11153 Pinguecula, bilateral: Secondary | ICD-10-CM | POA: Diagnosis not present

## 2017-06-30 DIAGNOSIS — H2513 Age-related nuclear cataract, bilateral: Secondary | ICD-10-CM | POA: Diagnosis not present

## 2017-07-17 DIAGNOSIS — H353121 Nonexudative age-related macular degeneration, left eye, early dry stage: Secondary | ICD-10-CM | POA: Diagnosis not present

## 2017-07-17 DIAGNOSIS — H35351 Cystoid macular degeneration, right eye: Secondary | ICD-10-CM | POA: Diagnosis not present

## 2017-07-17 DIAGNOSIS — H25013 Cortical age-related cataract, bilateral: Secondary | ICD-10-CM | POA: Diagnosis not present

## 2017-07-17 DIAGNOSIS — H2513 Age-related nuclear cataract, bilateral: Secondary | ICD-10-CM | POA: Diagnosis not present

## 2017-07-19 ENCOUNTER — Encounter (INDEPENDENT_AMBULATORY_CARE_PROVIDER_SITE_OTHER): Payer: PPO | Admitting: Ophthalmology

## 2017-07-19 DIAGNOSIS — H43813 Vitreous degeneration, bilateral: Secondary | ICD-10-CM | POA: Diagnosis not present

## 2017-07-19 DIAGNOSIS — H353211 Exudative age-related macular degeneration, right eye, with active choroidal neovascularization: Secondary | ICD-10-CM | POA: Diagnosis not present

## 2017-07-19 DIAGNOSIS — H2513 Age-related nuclear cataract, bilateral: Secondary | ICD-10-CM | POA: Diagnosis not present

## 2017-07-19 DIAGNOSIS — H353121 Nonexudative age-related macular degeneration, left eye, early dry stage: Secondary | ICD-10-CM | POA: Diagnosis not present

## 2017-08-03 ENCOUNTER — Ambulatory Visit
Admission: RE | Admit: 2017-08-03 | Discharge: 2017-08-03 | Disposition: A | Discharge status: Home / Self Care | Payer: PPO | Source: Ambulatory Visit | Attending: Family Medicine | Admitting: Family Medicine

## 2017-08-03 DIAGNOSIS — Z1231 Encounter for screening mammogram for malignant neoplasm of breast: Secondary | ICD-10-CM

## 2017-08-10 ENCOUNTER — Encounter (INDEPENDENT_AMBULATORY_CARE_PROVIDER_SITE_OTHER): Payer: PPO | Admitting: Ophthalmology

## 2017-08-10 DIAGNOSIS — H43813 Vitreous degeneration, bilateral: Secondary | ICD-10-CM

## 2017-08-10 DIAGNOSIS — H353121 Nonexudative age-related macular degeneration, left eye, early dry stage: Secondary | ICD-10-CM | POA: Diagnosis not present

## 2017-08-10 DIAGNOSIS — H2513 Age-related nuclear cataract, bilateral: Secondary | ICD-10-CM | POA: Diagnosis not present

## 2017-08-10 DIAGNOSIS — H353211 Exudative age-related macular degeneration, right eye, with active choroidal neovascularization: Secondary | ICD-10-CM | POA: Diagnosis not present

## 2017-09-06 ENCOUNTER — Encounter (INDEPENDENT_AMBULATORY_CARE_PROVIDER_SITE_OTHER): Payer: PPO | Admitting: Ophthalmology

## 2017-09-06 DIAGNOSIS — H353211 Exudative age-related macular degeneration, right eye, with active choroidal neovascularization: Secondary | ICD-10-CM

## 2017-09-06 DIAGNOSIS — H43813 Vitreous degeneration, bilateral: Secondary | ICD-10-CM

## 2017-09-06 DIAGNOSIS — H353121 Nonexudative age-related macular degeneration, left eye, early dry stage: Secondary | ICD-10-CM | POA: Diagnosis not present

## 2017-09-21 DIAGNOSIS — D225 Melanocytic nevi of trunk: Secondary | ICD-10-CM | POA: Diagnosis not present

## 2017-09-21 DIAGNOSIS — D2271 Melanocytic nevi of right lower limb, including hip: Secondary | ICD-10-CM | POA: Diagnosis not present

## 2017-09-21 DIAGNOSIS — L821 Other seborrheic keratosis: Secondary | ICD-10-CM | POA: Diagnosis not present

## 2017-09-21 DIAGNOSIS — L814 Other melanin hyperpigmentation: Secondary | ICD-10-CM | POA: Diagnosis not present

## 2017-09-21 DIAGNOSIS — D1801 Hemangioma of skin and subcutaneous tissue: Secondary | ICD-10-CM | POA: Diagnosis not present

## 2017-09-21 DIAGNOSIS — D2272 Melanocytic nevi of left lower limb, including hip: Secondary | ICD-10-CM | POA: Diagnosis not present

## 2017-10-04 ENCOUNTER — Encounter (INDEPENDENT_AMBULATORY_CARE_PROVIDER_SITE_OTHER): Payer: PPO | Admitting: Ophthalmology

## 2017-10-04 DIAGNOSIS — H353122 Nonexudative age-related macular degeneration, left eye, intermediate dry stage: Secondary | ICD-10-CM | POA: Diagnosis not present

## 2017-10-04 DIAGNOSIS — H353211 Exudative age-related macular degeneration, right eye, with active choroidal neovascularization: Secondary | ICD-10-CM

## 2017-10-04 DIAGNOSIS — H2513 Age-related nuclear cataract, bilateral: Secondary | ICD-10-CM | POA: Diagnosis not present

## 2017-10-04 DIAGNOSIS — H43813 Vitreous degeneration, bilateral: Secondary | ICD-10-CM

## 2017-11-06 ENCOUNTER — Encounter (INDEPENDENT_AMBULATORY_CARE_PROVIDER_SITE_OTHER): Payer: PPO | Admitting: Ophthalmology

## 2017-11-06 DIAGNOSIS — H353121 Nonexudative age-related macular degeneration, left eye, early dry stage: Secondary | ICD-10-CM | POA: Diagnosis not present

## 2017-11-06 DIAGNOSIS — H353211 Exudative age-related macular degeneration, right eye, with active choroidal neovascularization: Secondary | ICD-10-CM | POA: Diagnosis not present

## 2017-11-06 DIAGNOSIS — H2513 Age-related nuclear cataract, bilateral: Secondary | ICD-10-CM | POA: Diagnosis not present

## 2017-11-06 DIAGNOSIS — H43813 Vitreous degeneration, bilateral: Secondary | ICD-10-CM

## 2017-12-04 ENCOUNTER — Encounter (INDEPENDENT_AMBULATORY_CARE_PROVIDER_SITE_OTHER): Payer: PPO | Admitting: Ophthalmology

## 2017-12-04 DIAGNOSIS — H43813 Vitreous degeneration, bilateral: Secondary | ICD-10-CM | POA: Diagnosis not present

## 2017-12-04 DIAGNOSIS — H2513 Age-related nuclear cataract, bilateral: Secondary | ICD-10-CM

## 2017-12-04 DIAGNOSIS — H353121 Nonexudative age-related macular degeneration, left eye, early dry stage: Secondary | ICD-10-CM | POA: Diagnosis not present

## 2017-12-04 DIAGNOSIS — H353211 Exudative age-related macular degeneration, right eye, with active choroidal neovascularization: Secondary | ICD-10-CM | POA: Diagnosis not present

## 2017-12-27 DIAGNOSIS — E7800 Pure hypercholesterolemia, unspecified: Secondary | ICD-10-CM | POA: Diagnosis not present

## 2017-12-27 DIAGNOSIS — F325 Major depressive disorder, single episode, in full remission: Secondary | ICD-10-CM | POA: Diagnosis not present

## 2017-12-27 DIAGNOSIS — Z23 Encounter for immunization: Secondary | ICD-10-CM | POA: Diagnosis not present

## 2018-01-01 ENCOUNTER — Encounter (INDEPENDENT_AMBULATORY_CARE_PROVIDER_SITE_OTHER): Payer: PPO | Admitting: Ophthalmology

## 2018-01-01 DIAGNOSIS — H353211 Exudative age-related macular degeneration, right eye, with active choroidal neovascularization: Secondary | ICD-10-CM | POA: Diagnosis not present

## 2018-01-01 DIAGNOSIS — H353121 Nonexudative age-related macular degeneration, left eye, early dry stage: Secondary | ICD-10-CM

## 2018-01-01 DIAGNOSIS — H2513 Age-related nuclear cataract, bilateral: Secondary | ICD-10-CM | POA: Diagnosis not present

## 2018-01-01 DIAGNOSIS — H43813 Vitreous degeneration, bilateral: Secondary | ICD-10-CM | POA: Diagnosis not present

## 2018-01-29 ENCOUNTER — Encounter (INDEPENDENT_AMBULATORY_CARE_PROVIDER_SITE_OTHER): Payer: PPO | Admitting: Ophthalmology

## 2018-01-29 DIAGNOSIS — H353211 Exudative age-related macular degeneration, right eye, with active choroidal neovascularization: Secondary | ICD-10-CM | POA: Diagnosis not present

## 2018-01-29 DIAGNOSIS — H2513 Age-related nuclear cataract, bilateral: Secondary | ICD-10-CM

## 2018-01-29 DIAGNOSIS — H43813 Vitreous degeneration, bilateral: Secondary | ICD-10-CM | POA: Diagnosis not present

## 2018-01-29 DIAGNOSIS — H353121 Nonexudative age-related macular degeneration, left eye, early dry stage: Secondary | ICD-10-CM | POA: Diagnosis not present

## 2018-02-26 ENCOUNTER — Encounter (INDEPENDENT_AMBULATORY_CARE_PROVIDER_SITE_OTHER): Payer: PPO | Admitting: Ophthalmology

## 2018-02-26 DIAGNOSIS — H2513 Age-related nuclear cataract, bilateral: Secondary | ICD-10-CM

## 2018-02-26 DIAGNOSIS — H353211 Exudative age-related macular degeneration, right eye, with active choroidal neovascularization: Secondary | ICD-10-CM | POA: Diagnosis not present

## 2018-02-26 DIAGNOSIS — H43813 Vitreous degeneration, bilateral: Secondary | ICD-10-CM

## 2018-02-26 DIAGNOSIS — H353121 Nonexudative age-related macular degeneration, left eye, early dry stage: Secondary | ICD-10-CM | POA: Diagnosis not present

## 2018-03-26 ENCOUNTER — Encounter (INDEPENDENT_AMBULATORY_CARE_PROVIDER_SITE_OTHER): Payer: PPO | Admitting: Ophthalmology

## 2018-03-26 DIAGNOSIS — H353211 Exudative age-related macular degeneration, right eye, with active choroidal neovascularization: Secondary | ICD-10-CM | POA: Diagnosis not present

## 2018-03-26 DIAGNOSIS — H353121 Nonexudative age-related macular degeneration, left eye, early dry stage: Secondary | ICD-10-CM | POA: Diagnosis not present

## 2018-03-26 DIAGNOSIS — H43813 Vitreous degeneration, bilateral: Secondary | ICD-10-CM | POA: Diagnosis not present

## 2018-03-26 DIAGNOSIS — H2513 Age-related nuclear cataract, bilateral: Secondary | ICD-10-CM | POA: Diagnosis not present

## 2018-04-26 ENCOUNTER — Encounter (INDEPENDENT_AMBULATORY_CARE_PROVIDER_SITE_OTHER): Payer: PPO | Admitting: Ophthalmology

## 2018-04-26 DIAGNOSIS — H2513 Age-related nuclear cataract, bilateral: Secondary | ICD-10-CM | POA: Diagnosis not present

## 2018-04-26 DIAGNOSIS — H353121 Nonexudative age-related macular degeneration, left eye, early dry stage: Secondary | ICD-10-CM

## 2018-04-26 DIAGNOSIS — H43813 Vitreous degeneration, bilateral: Secondary | ICD-10-CM | POA: Diagnosis not present

## 2018-04-26 DIAGNOSIS — H353211 Exudative age-related macular degeneration, right eye, with active choroidal neovascularization: Secondary | ICD-10-CM

## 2018-05-22 DIAGNOSIS — Z1389 Encounter for screening for other disorder: Secondary | ICD-10-CM | POA: Diagnosis not present

## 2018-05-22 DIAGNOSIS — F325 Major depressive disorder, single episode, in full remission: Secondary | ICD-10-CM | POA: Diagnosis not present

## 2018-05-22 DIAGNOSIS — E7800 Pure hypercholesterolemia, unspecified: Secondary | ICD-10-CM | POA: Diagnosis not present

## 2018-05-22 DIAGNOSIS — E2839 Other primary ovarian failure: Secondary | ICD-10-CM | POA: Diagnosis not present

## 2018-05-22 DIAGNOSIS — K7581 Nonalcoholic steatohepatitis (NASH): Secondary | ICD-10-CM | POA: Diagnosis not present

## 2018-05-22 DIAGNOSIS — Z0000 Encounter for general adult medical examination without abnormal findings: Secondary | ICD-10-CM | POA: Diagnosis not present

## 2018-05-22 DIAGNOSIS — K219 Gastro-esophageal reflux disease without esophagitis: Secondary | ICD-10-CM | POA: Diagnosis not present

## 2018-05-23 ENCOUNTER — Encounter (INDEPENDENT_AMBULATORY_CARE_PROVIDER_SITE_OTHER): Payer: PPO | Admitting: Ophthalmology

## 2018-05-23 DIAGNOSIS — H2513 Age-related nuclear cataract, bilateral: Secondary | ICD-10-CM

## 2018-05-23 DIAGNOSIS — H43813 Vitreous degeneration, bilateral: Secondary | ICD-10-CM | POA: Diagnosis not present

## 2018-05-23 DIAGNOSIS — H353121 Nonexudative age-related macular degeneration, left eye, early dry stage: Secondary | ICD-10-CM | POA: Diagnosis not present

## 2018-05-23 DIAGNOSIS — H353211 Exudative age-related macular degeneration, right eye, with active choroidal neovascularization: Secondary | ICD-10-CM

## 2018-05-29 ENCOUNTER — Other Ambulatory Visit: Payer: Self-pay | Admitting: Family Medicine

## 2018-05-29 DIAGNOSIS — E2839 Other primary ovarian failure: Secondary | ICD-10-CM

## 2018-05-29 DIAGNOSIS — Z1231 Encounter for screening mammogram for malignant neoplasm of breast: Secondary | ICD-10-CM

## 2018-06-27 ENCOUNTER — Encounter (INDEPENDENT_AMBULATORY_CARE_PROVIDER_SITE_OTHER): Payer: Medicare Other | Admitting: Ophthalmology

## 2018-06-27 DIAGNOSIS — H43813 Vitreous degeneration, bilateral: Secondary | ICD-10-CM

## 2018-06-27 DIAGNOSIS — H2513 Age-related nuclear cataract, bilateral: Secondary | ICD-10-CM | POA: Diagnosis not present

## 2018-06-27 DIAGNOSIS — H353121 Nonexudative age-related macular degeneration, left eye, early dry stage: Secondary | ICD-10-CM | POA: Diagnosis not present

## 2018-06-27 DIAGNOSIS — H353211 Exudative age-related macular degeneration, right eye, with active choroidal neovascularization: Secondary | ICD-10-CM | POA: Diagnosis not present

## 2018-08-06 ENCOUNTER — Ambulatory Visit: Payer: PPO

## 2018-08-06 ENCOUNTER — Other Ambulatory Visit: Payer: PPO

## 2018-08-06 ENCOUNTER — Encounter (INDEPENDENT_AMBULATORY_CARE_PROVIDER_SITE_OTHER): Payer: Medicare Other | Admitting: Ophthalmology

## 2018-08-06 ENCOUNTER — Other Ambulatory Visit: Payer: Self-pay

## 2018-08-06 DIAGNOSIS — H353211 Exudative age-related macular degeneration, right eye, with active choroidal neovascularization: Secondary | ICD-10-CM

## 2018-08-06 DIAGNOSIS — H353121 Nonexudative age-related macular degeneration, left eye, early dry stage: Secondary | ICD-10-CM | POA: Diagnosis not present

## 2018-08-06 DIAGNOSIS — H43813 Vitreous degeneration, bilateral: Secondary | ICD-10-CM | POA: Diagnosis not present

## 2018-09-03 ENCOUNTER — Other Ambulatory Visit: Payer: Self-pay

## 2018-09-03 ENCOUNTER — Encounter (INDEPENDENT_AMBULATORY_CARE_PROVIDER_SITE_OTHER): Payer: Medicare Other | Admitting: Ophthalmology

## 2018-09-03 DIAGNOSIS — H43813 Vitreous degeneration, bilateral: Secondary | ICD-10-CM

## 2018-09-03 DIAGNOSIS — H353121 Nonexudative age-related macular degeneration, left eye, early dry stage: Secondary | ICD-10-CM | POA: Diagnosis not present

## 2018-09-03 DIAGNOSIS — H353211 Exudative age-related macular degeneration, right eye, with active choroidal neovascularization: Secondary | ICD-10-CM | POA: Diagnosis not present

## 2018-09-03 DIAGNOSIS — H2513 Age-related nuclear cataract, bilateral: Secondary | ICD-10-CM

## 2018-10-01 ENCOUNTER — Encounter (INDEPENDENT_AMBULATORY_CARE_PROVIDER_SITE_OTHER): Payer: Medicare Other | Admitting: Ophthalmology

## 2018-10-01 ENCOUNTER — Other Ambulatory Visit: Payer: Self-pay

## 2018-10-01 DIAGNOSIS — H353211 Exudative age-related macular degeneration, right eye, with active choroidal neovascularization: Secondary | ICD-10-CM | POA: Diagnosis not present

## 2018-10-01 DIAGNOSIS — H353121 Nonexudative age-related macular degeneration, left eye, early dry stage: Secondary | ICD-10-CM

## 2018-10-29 ENCOUNTER — Encounter (INDEPENDENT_AMBULATORY_CARE_PROVIDER_SITE_OTHER): Payer: Medicare Other | Admitting: Ophthalmology

## 2018-10-29 ENCOUNTER — Other Ambulatory Visit: Payer: Self-pay

## 2018-10-29 DIAGNOSIS — H353211 Exudative age-related macular degeneration, right eye, with active choroidal neovascularization: Secondary | ICD-10-CM

## 2018-10-29 DIAGNOSIS — H353122 Nonexudative age-related macular degeneration, left eye, intermediate dry stage: Secondary | ICD-10-CM

## 2018-10-29 DIAGNOSIS — H43813 Vitreous degeneration, bilateral: Secondary | ICD-10-CM | POA: Diagnosis not present

## 2018-11-13 ENCOUNTER — Ambulatory Visit
Admission: RE | Admit: 2018-11-13 | Discharge: 2018-11-13 | Disposition: A | Discharge status: Home / Self Care | Payer: PPO | Source: Ambulatory Visit | Attending: Family Medicine | Admitting: Family Medicine

## 2018-11-13 ENCOUNTER — Other Ambulatory Visit: Payer: Self-pay

## 2018-11-13 DIAGNOSIS — Z1231 Encounter for screening mammogram for malignant neoplasm of breast: Secondary | ICD-10-CM

## 2018-11-13 DIAGNOSIS — E2839 Other primary ovarian failure: Secondary | ICD-10-CM

## 2019-05-20 IMAGING — MG DIGITAL SCREENING BILATERAL MAMMOGRAM WITH TOMO AND CAD
8 series · 9 of 24 positions shown · non-contrast
Comparison: Previous exam(s).

CLINICAL DATA: Screening.

EXAM:
DIGITAL SCREENING BILATERAL MAMMOGRAM WITH TOMO AND CAD

[L MLO synth-2D]
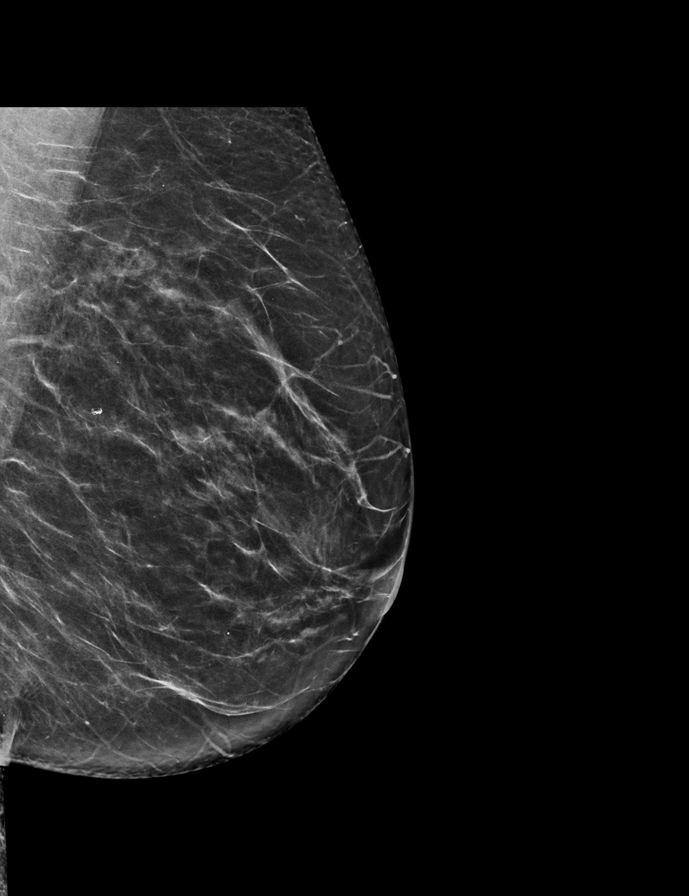

[L CC synth-2D]
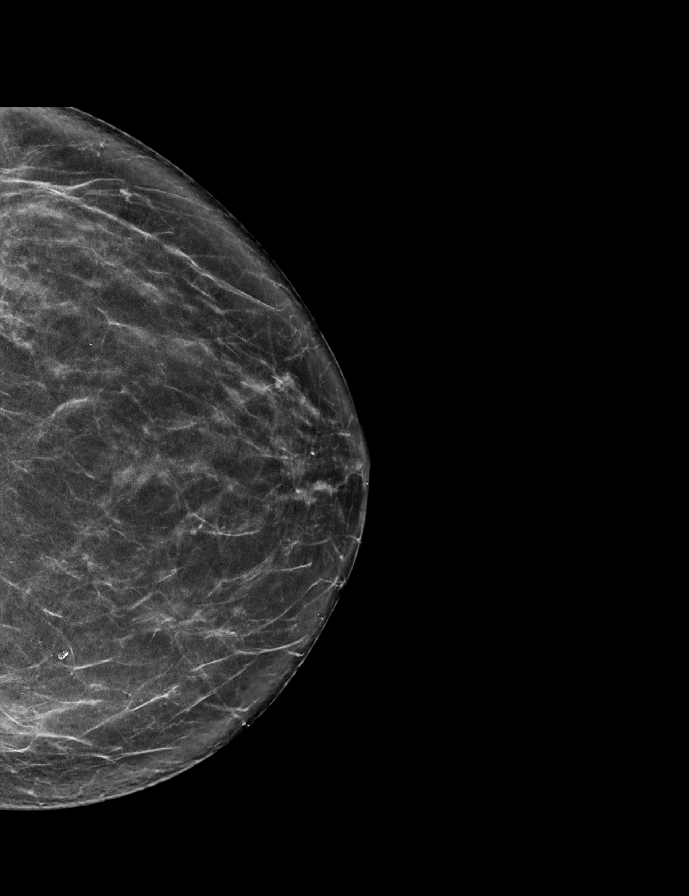

[R MLO synth-2D]
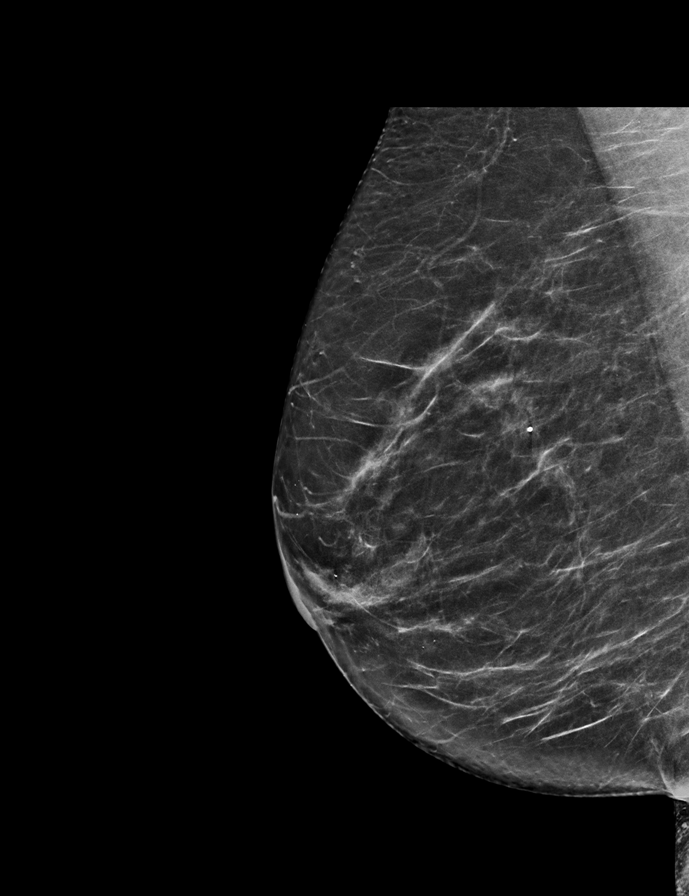

[R CC synth-2D]
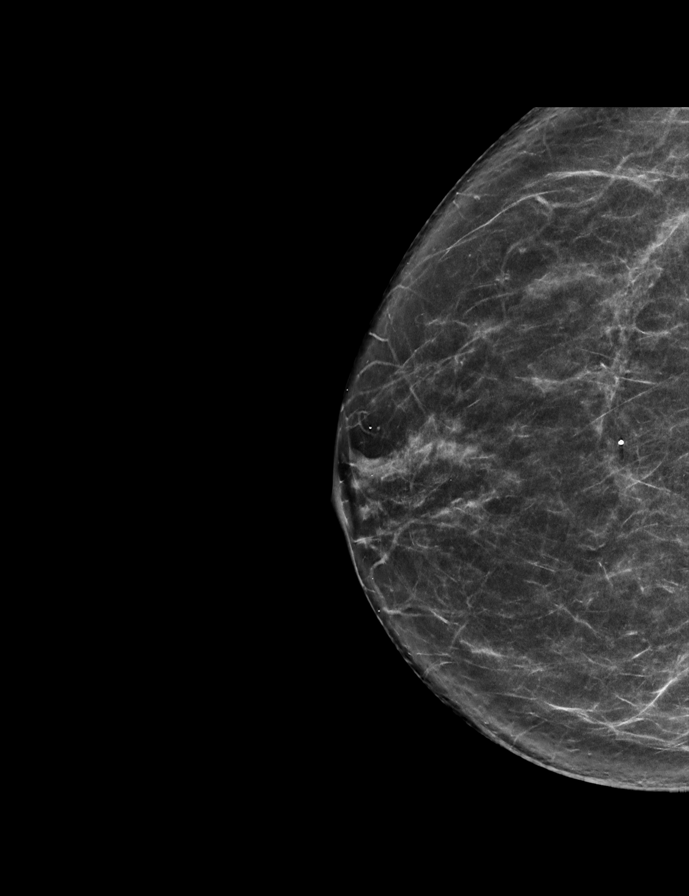

[L CC tomo · 2 of 70 frames shown]
[frame 23/70]
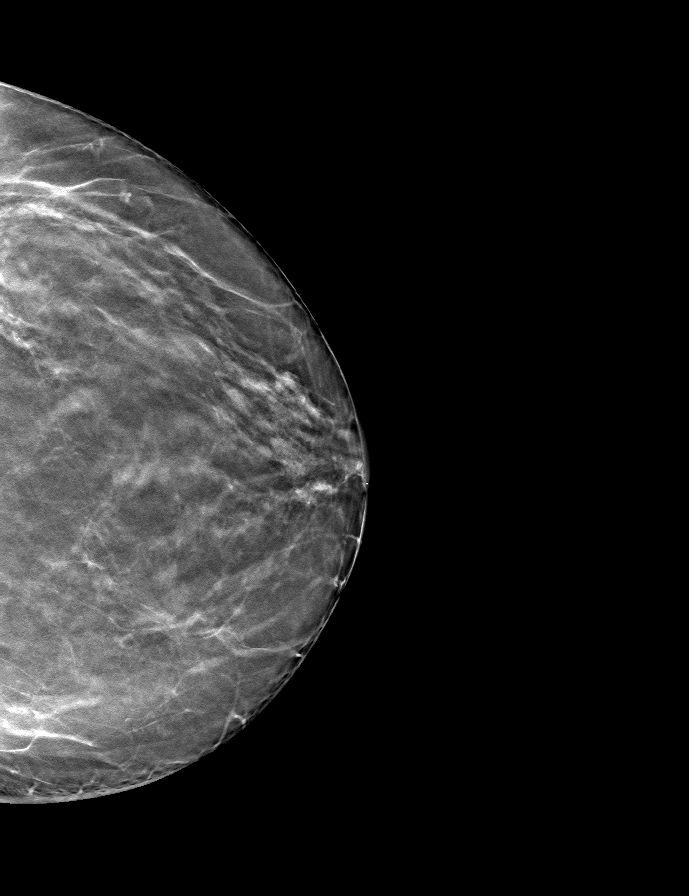
[frame 35/70]
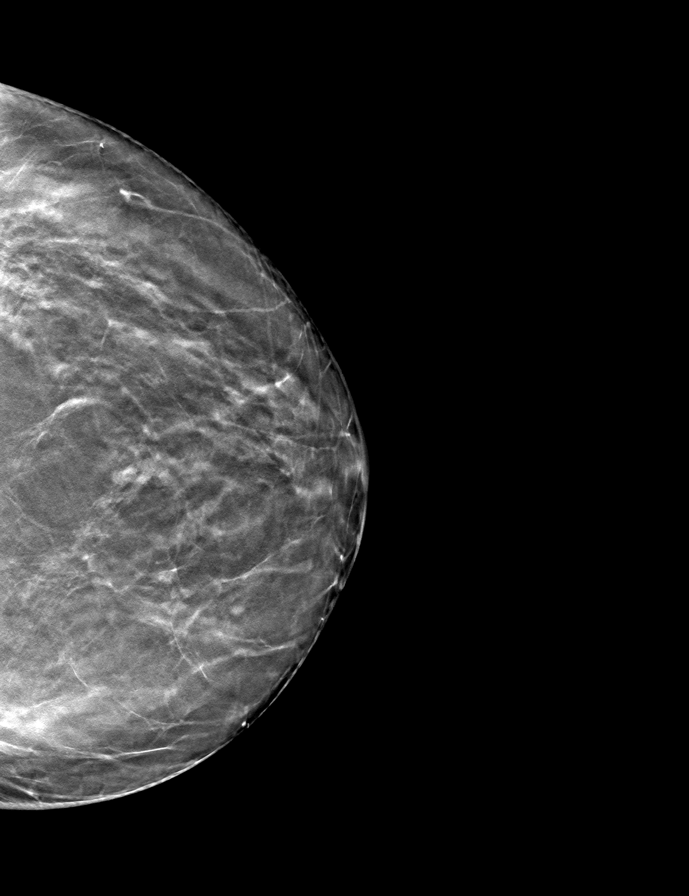

[L MLO tomo · tomo slice 35/69.0]
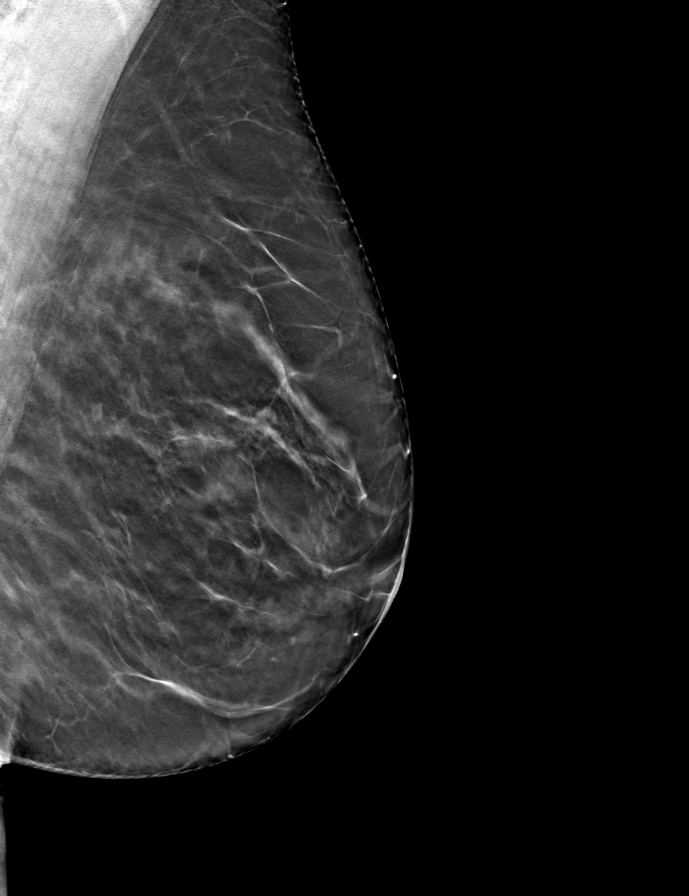

[R CC tomo · tomo slice 35/69.0]
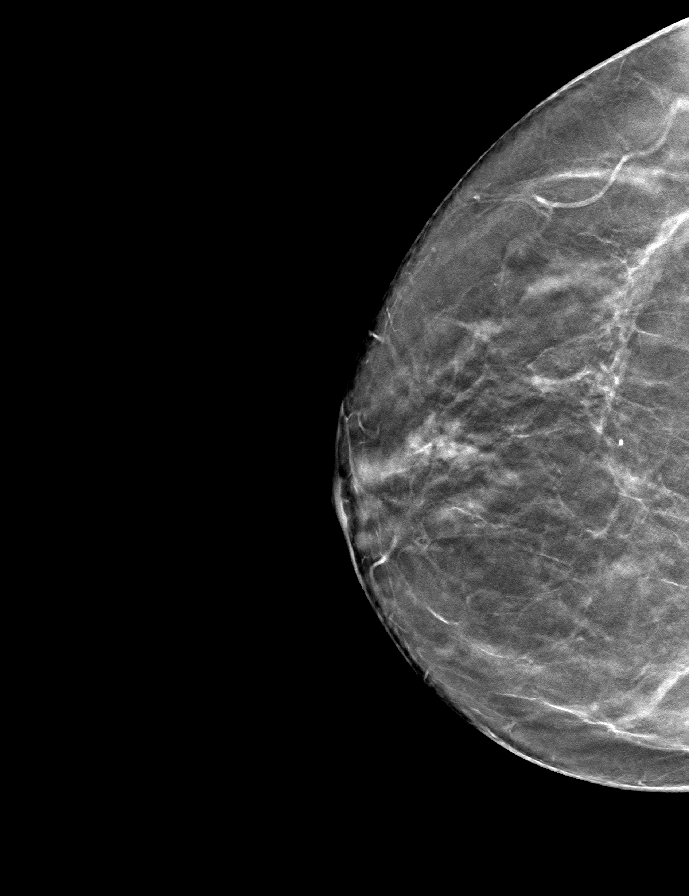

[R MLO tomo · tomo slice 35/68.0]
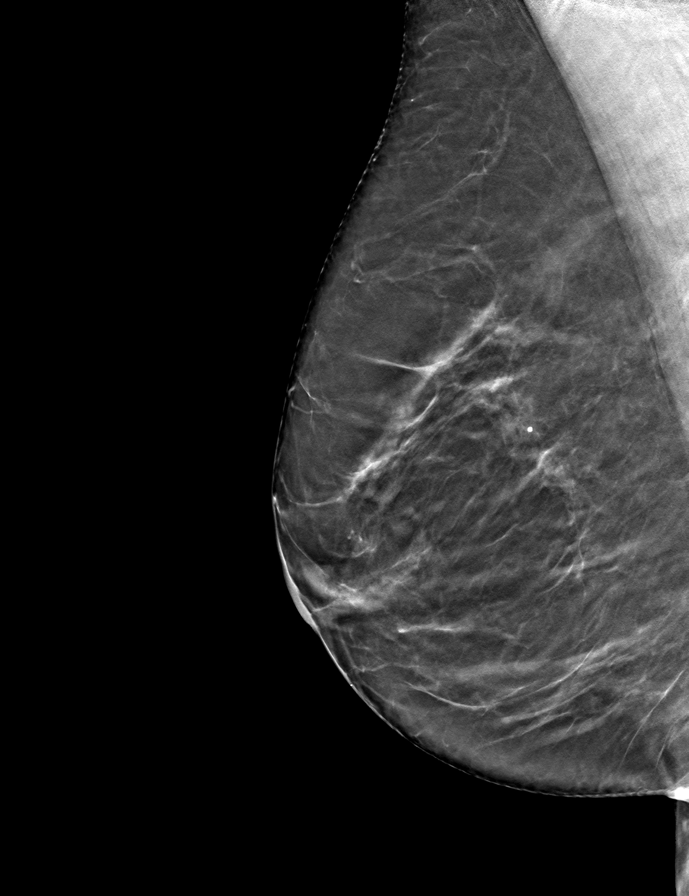

[9 of 24 positions shown; findings below may reference images not displayed]

ACR Breast Density Category b: There are scattered areas of
fibroglandular density.
FINDINGS: There are no findings suspicious for malignancy. Images were
processed with CAD.
IMPRESSION: No mammographic evidence of malignancy. A result letter of this
screening mammogram will be mailed directly to the patient.

RECOMMENDATION:
Screening mammogram in one year. (Code:[id number])

BI-RADS CATEGORY  1: Negative.
